# Patient Record
Sex: Female | Born: 1970
Health system: Southern US, Community
[De-identification: ages and names within clinical notes are randomized; demographics above are authoritative.]

## PROBLEM LIST (undated history)

## (undated) HISTORY — PX: TOOTH EXTRACTION: SUR596

## (undated) HISTORY — PX: KNEE CARTILAGE SURGERY: SHX688

---

## 1998-03-01 ENCOUNTER — Inpatient Hospital Stay (HOSPITAL_COMMUNITY): Admission: AD | Admit: 1998-03-01 | Discharge: 1998-03-05 | Payer: Self-pay | Admitting: Obstetrics and Gynecology

## 1998-04-11 ENCOUNTER — Other Ambulatory Visit: Admission: RE | Admit: 1998-04-11 | Discharge: 1998-04-11 | Payer: Self-pay | Admitting: Obstetrics and Gynecology

## 1999-05-06 ENCOUNTER — Other Ambulatory Visit: Admission: RE | Admit: 1999-05-06 | Discharge: 1999-05-06 | Payer: Self-pay | Admitting: Obstetrics and Gynecology

## 1999-12-02 HISTORY — PX: DILATION AND CURETTAGE OF UTERUS: SHX78

## 2000-09-18 ENCOUNTER — Inpatient Hospital Stay (HOSPITAL_COMMUNITY): Admission: AD | Admit: 2000-09-18 | Discharge: 2000-09-21 | Payer: Self-pay | Admitting: Obstetrics and Gynecology

## 2000-09-27 ENCOUNTER — Observation Stay (HOSPITAL_COMMUNITY): Admission: AD | Admit: 2000-09-27 | Discharge: 2000-09-28 | Payer: Self-pay | Admitting: Obstetrics and Gynecology

## 2000-09-27 ENCOUNTER — Encounter: Payer: Self-pay | Admitting: Obstetrics and Gynecology

## 2000-10-29 ENCOUNTER — Other Ambulatory Visit: Admission: RE | Admit: 2000-10-29 | Discharge: 2000-10-29 | Payer: Self-pay | Admitting: Obstetrics and Gynecology

## 2002-03-21 ENCOUNTER — Other Ambulatory Visit: Admission: RE | Admit: 2002-03-21 | Discharge: 2002-03-21 | Payer: Self-pay | Admitting: Obstetrics and Gynecology

## 2003-04-21 ENCOUNTER — Other Ambulatory Visit: Admission: RE | Admit: 2003-04-21 | Discharge: 2003-04-21 | Payer: Self-pay | Admitting: Obstetrics and Gynecology

## 2004-05-28 ENCOUNTER — Other Ambulatory Visit: Admission: RE | Admit: 2004-05-28 | Discharge: 2004-05-28 | Payer: Self-pay | Admitting: Obstetrics and Gynecology

## 2005-07-21 ENCOUNTER — Other Ambulatory Visit: Admission: RE | Admit: 2005-07-21 | Discharge: 2005-07-21 | Payer: Self-pay | Admitting: Obstetrics and Gynecology

## 2005-08-22 ENCOUNTER — Ambulatory Visit (HOSPITAL_COMMUNITY): Admission: RE | Admit: 2005-08-22 | Discharge: 2005-08-22 | Payer: Self-pay | Admitting: Obstetrics and Gynecology

## 2011-06-12 ENCOUNTER — Other Ambulatory Visit: Payer: Self-pay | Admitting: Obstetrics and Gynecology

## 2011-09-08 ENCOUNTER — Other Ambulatory Visit: Payer: Self-pay | Admitting: Orthopedic Surgery

## 2011-09-08 DIAGNOSIS — M25561 Pain in right knee: Secondary | ICD-10-CM

## 2011-09-11 ENCOUNTER — Ambulatory Visit
Admission: RE | Admit: 2011-09-11 | Discharge: 2011-09-11 | Disposition: A | Discharge status: Home / Self Care | Payer: BC Managed Care – PPO | Source: Ambulatory Visit | Attending: Orthopedic Surgery | Admitting: Orthopedic Surgery

## 2011-09-11 DIAGNOSIS — M25561 Pain in right knee: Secondary | ICD-10-CM

## 2014-10-24 ENCOUNTER — Other Ambulatory Visit: Payer: Self-pay | Admitting: Orthopaedic Surgery

## 2014-10-24 DIAGNOSIS — M25511 Pain in right shoulder: Secondary | ICD-10-CM

## 2014-11-27 ENCOUNTER — Ambulatory Visit
Admission: RE | Admit: 2014-11-27 | Discharge: 2014-11-27 | Disposition: A | Discharge status: Home / Self Care | Payer: 59 | Source: Ambulatory Visit | Attending: Orthopaedic Surgery | Admitting: Orthopaedic Surgery

## 2014-11-27 DIAGNOSIS — M25511 Pain in right shoulder: Secondary | ICD-10-CM

## 2015-03-27 ENCOUNTER — Other Ambulatory Visit: Payer: Self-pay | Admitting: Obstetrics and Gynecology

## 2015-03-29 LAB — CYTOLOGY - PAP

## 2016-10-09 ENCOUNTER — Ambulatory Visit (INDEPENDENT_AMBULATORY_CARE_PROVIDER_SITE_OTHER): Payer: BLUE CROSS/BLUE SHIELD

## 2016-10-09 ENCOUNTER — Encounter: Payer: Self-pay | Admitting: Podiatry

## 2016-10-09 ENCOUNTER — Ambulatory Visit (INDEPENDENT_AMBULATORY_CARE_PROVIDER_SITE_OTHER): Payer: BLUE CROSS/BLUE SHIELD | Admitting: Podiatry

## 2016-10-09 VITALS — BP 128/84 | HR 85 | Resp 16 | Ht 67.0 in | Wt 240.0 lb

## 2016-10-09 DIAGNOSIS — M79671 Pain in right foot: Secondary | ICD-10-CM

## 2016-10-09 DIAGNOSIS — D492 Neoplasm of unspecified behavior of bone, soft tissue, and skin: Secondary | ICD-10-CM

## 2016-10-09 NOTE — Progress Notes (Signed)
   Subjective:    Patient ID: Angela Campos, female    DOB: 1971-11-23, 45 y.o.   MRN: [id number]  HPI Chief Complaint  Patient presents with  . Foot Pain    Right foot; midfoot; hard knot; pt stated, "has had hard knot for the past week and there is a bruise underneath it"      Review of Systems  All other systems reviewed and are negative.      Objective:   Physical Exam        Assessment & Plan:

## 2016-10-09 NOTE — Progress Notes (Signed)
Subjective:     Patient ID: Angela Campos, female   DOB: [birth year], 45 y.o.   MRN: [id number]  HPI patient presents with a small nodule on the distal portion of the right arch mid arch area with bruising proximal to this. States she's noticed it for a week it's been painful but not as painful last few days   Review of Systems  All other systems reviewed and are negative.      Objective:   Physical Exam  Constitutional: She is oriented to person, place, and time.  Cardiovascular: Intact distal pulses.   Musculoskeletal: Normal range of motion.  Neurological: She is oriented to person, place, and time.  Skin: Skin is warm.  Nursing note and vitals reviewed.  neurovascular status intact muscle strength adequate range of motion within normal limits with patient noted to have a small 3 x 3 mm nodule plantar aspect right foot distal to the fascia before the metatarsal heads with a small area of bruising proximal to it. The bruising is nonpainful and a nodule is mildly to moderately painful and not as bad as it was several days ago. Patient's found have good digital perfusion well oriented 3     Assessment:     Nodule which may be a soft tissue tumor or congealed hematoma or possible fibroma    Plan:    H&P x-rays reviewed and today I have advised on heat ice therapy for this and if symptoms were to persist for another month we may try to inject with steroid or it may need to be excised. Patient will be seen back for [place] to recheck again in 4 weeks or earlier if needed   X-ray was negative for signs of bony calcification fracture or other bone pathology

## 2017-05-07 ENCOUNTER — Ambulatory Visit (HOSPITAL_COMMUNITY)
Admission: EM | Admit: 2017-05-07 | Discharge: 2017-05-07 | Disposition: A | Discharge status: Home / Self Care | Payer: BLUE CROSS/BLUE SHIELD | Attending: Internal Medicine | Admitting: Internal Medicine

## 2017-05-07 ENCOUNTER — Encounter (HOSPITAL_COMMUNITY): Payer: Self-pay | Admitting: Emergency Medicine

## 2017-05-07 ENCOUNTER — Ambulatory Visit (INDEPENDENT_AMBULATORY_CARE_PROVIDER_SITE_OTHER): Payer: BLUE CROSS/BLUE SHIELD

## 2017-05-07 DIAGNOSIS — R042 Hemoptysis: Secondary | ICD-10-CM

## 2017-05-07 MED ORDER — IPRATROPIUM BROMIDE 0.06 % NA SOLN: 2.0000 | Freq: Four times a day (QID) | NASAL | 0 refills | Status: DC

## 2017-05-07 NOTE — ED Provider Notes (Signed)
CSN: [id number]     Arrival date & time 05/07/17  2423 History   None    Chief Complaint  Patient presents with  . URI   (Consider location/radiation/quality/duration/timing/severity/associated sxs/prior Treatment) Patient c/o spitting up some blood this am in the shower and when she was driving to work.  She states she feels like she has had some cold sx's like some sinus congestion but denies fever, cough, or chest congestion.  She takes an occasional aleve for arthritis sx's.  She seldom comes to the doctor she states.     The history is provided by the patient.  URI  Presenting symptoms: congestion   Severity:  Mild Onset quality:  Sudden Duration:  1 day Timing:  Constant Chronicity:  New Relieved by:  Nothing Worsened by:  Nothing Ineffective treatments:  None tried   History reviewed. No pertinent past medical history. History reviewed. No pertinent surgical history. History reviewed. No pertinent family history. Social History  Substance Use Topics  . Smoking status: Never Smoker  . Smokeless tobacco: Never Used  . Alcohol use Yes   OB History    No data available     Review of Systems  Constitutional: Negative.   HENT: Positive for congestion.   Eyes: Negative.   Respiratory: Negative.   Cardiovascular: Negative.   Gastrointestinal: Negative.   Endocrine: Negative.   Genitourinary: Negative.   Musculoskeletal: Negative.   Allergic/Immunologic: Negative.   Neurological: Negative.   Hematological: Negative.   Psychiatric/Behavioral: Negative.     Allergies  Patient has no known allergies.  Home Medications   Prior to Admission medications   Medication Sig Start Date End Date Taking? Authorizing Provider  ipratropium (ATROVENT) 0.06 % nasal spray Place 2 sprays into both nostrils 4 (four) times daily. 05/07/17   Lysbeth Penner, FNP   Meds Ordered and Administered this Visit  Medications - No data to display  BP (!) 148/67 (BP Location: Left Arm)  Comment: rn notified   Pulse 75   Temp 98.1 F (36.7 C) (Oral)   Resp 16   SpO2 96%  No data found.   Physical Exam  Constitutional: She appears well-developed and well-nourished.  HENT:  Head: Normocephalic and atraumatic.  Eyes: Conjunctivae and EOM are normal. Pupils are equal, round, and reactive to light.  Neck: Normal range of motion. Neck supple.  Cardiovascular: Normal rate, regular rhythm and normal heart sounds.   Pulmonary/Chest: Effort normal and breath sounds normal.  Nursing note and vitals reviewed.   Urgent Care Course     Procedures (including critical care time)  Labs Review Labs Reviewed - No data to display  Imaging Review Dg Chest 2 View  Result Date: 05/07/2017 CLINICAL DATA:  Hemoptysis, single episode EXAM: CHEST  2 VIEW COMPARISON:  None. FINDINGS: There is scarring in the medial right mid lower lung zone. Lungs elsewhere clear. Heart is upper normal in size with pulmonary vascularity within normal limits. No adenopathy no bone lesions. IMPRESSION: No edema or consolidation. Scarring medial right lung. No adenopathy evident. Electronically Signed   By: Lowella Grip III M.D.   On: 05/07/2017 10:48     Visual Acuity Review  Right Eye Distance:   Left Eye Distance:   Bilateral Distance:    Right Eye Near:   Left Eye Near:    Bilateral Near:         MDM   1. Hemoptysis    Ipratropium nasal spray  Reassurance given and explained that this may  be from viral uri and sinus congestion etc.   Follow up with PCP      Lysbeth Penner, FNP 05/07/17 1114

## 2017-05-07 NOTE — ED Triage Notes (Signed)
Pt c/o cold sx onset 2 days associated w/bloody sputum that began today  Denies fevers, chills, fatigue, malaise  A&O x4... NAD.... ambulatory

## 2018-01-27 ENCOUNTER — Encounter (INDEPENDENT_AMBULATORY_CARE_PROVIDER_SITE_OTHER): Payer: Self-pay | Admitting: Orthopaedic Surgery

## 2018-01-27 ENCOUNTER — Ambulatory Visit (INDEPENDENT_AMBULATORY_CARE_PROVIDER_SITE_OTHER): Payer: BLUE CROSS/BLUE SHIELD | Admitting: Orthopaedic Surgery

## 2018-01-27 DIAGNOSIS — M25061 Hemarthrosis, right knee: Secondary | ICD-10-CM

## 2018-01-27 DIAGNOSIS — M25561 Pain in right knee: Secondary | ICD-10-CM

## 2018-01-27 MED ORDER — METHYLPREDNISOLONE ACETATE 40 MG/ML IJ SUSP
40.0000 mg | INTRAMUSCULAR | Status: AC | PRN
  Administered 2018-01-27: 40 mg via INTRA_ARTICULAR

## 2018-01-27 MED ORDER — LIDOCAINE HCL 1 % IJ SOLN
3.0000 mL | INTRAMUSCULAR | Status: AC | PRN
  Administered 2018-01-27: 3 mL

## 2018-01-27 NOTE — Progress Notes (Signed)
Office Visit Note   Patient: Angela Campos           Date of Birth: 10-Apr-1971           MRN: [id number] Visit Date: 01/27/2018              Requested by: No referring provider defined for this encounter. PCP: Patient, No Pcp Per   Assessment & Plan: Visit Diagnoses:  1. Acute pain of right knee   2. Hemarthrosis of right knee     Plan: Given the large knee effusion I was able to tap her knee.  I aspirated 80 cc of a large hemarthrosis from her right knee.  I then placed an injection of a steroid and lidocaine in the knee.  I will place her in a hinged knee brace.  At this point an MRI is medically warranted given the large hemarthrosis we found in her right knee.  This right knee MRI will be needed to assess the ligamentous structures of the knee and the joint capsule as well as a meniscus given the large hemarthrosis showing that this is an acute injury.  She will continue her crutch for now until she feels better getting around on her knee.  She will work on flexion extension of it while awaiting the MRI.  All questions concerns were answered and addressed.  Follow-Up Instructions: Return in about 2 weeks (around 02/10/2018).   Orders:  No orders of the defined types were placed in this encounter.  No orders of the defined types were placed in this encounter.     Procedures: Large Joint Inj: R knee on 01/27/2018 1:19 PM Indications: diagnostic evaluation and pain Details: 22 G 1.5 in needle, superolateral approach  Arthrogram: No  Medications: 3 mL lidocaine 1 %; 40 mg methylPREDNISolone acetate 40 MG/ML Outcome: tolerated well, no immediate complications Procedure, treatment alternatives, risks and benefits explained, specific risks discussed. Consent was given by the patient. Immediately prior to procedure a time out was called to verify the correct patient, procedure, equipment, support staff and site/side marked as required. Patient was prepped and draped in the usual  sterile fashion.       Clinical Data: No additional findings.   Subjective: Chief Complaint  Patient presents with  . Right Foot - Pain, Injury  The patient comes in today with acute right knee injury.  This happened yesterday when she was in bed and twisted her knee.  She is unable to straighten her knee out and comes in today using a crutch being unable to put weight on her right knee.  She says she is abundant amount of swelling and pain in the back of her knee as well.  She actually had arthroscopic intervention of this knee in 2012 for a symptomatic plica and a small meniscal tear.  This was done elsewhere.  She had no problems with her knee until this accident yesterday.  HPI  Review of Systems She currently denies any headache, chest pain, shortness of breath, fever, chills, nausea, vomiting.  Objective: Vital Signs: There were no vitals taken for this visit.  Physical Exam She is alert and oriented x3 and in no acute distress Ortho Exam Examination of her right knee shows a very large effusion.  It is keeping me from really fully flexing and extending her knee.  She has medial joint line tenderness and a positive McMurray on the medial side.  She has some slight laxity in her ACL as well. Specialty  Comments:  No specialty comments available.  Imaging: No results found.   PMFS History: Patient Active Problem List   Diagnosis Date Noted  . Acute pain of right knee 01/27/2018   History reviewed. No pertinent past medical history.  History reviewed. No pertinent family history.  History reviewed. No pertinent surgical history. Social History   Occupational History  . Not on file  Tobacco Use  . Smoking status: Never Smoker  . Smokeless tobacco: Never Used  Substance and Sexual Activity  . Alcohol use: Yes  . Drug use: No  . Sexual activity: Not on file

## 2018-01-29 ENCOUNTER — Other Ambulatory Visit (INDEPENDENT_AMBULATORY_CARE_PROVIDER_SITE_OTHER): Payer: Self-pay

## 2018-01-29 DIAGNOSIS — M25561 Pain in right knee: Principal | ICD-10-CM

## 2018-01-29 DIAGNOSIS — G8929 Other chronic pain: Secondary | ICD-10-CM

## 2018-02-09 ENCOUNTER — Ambulatory Visit
Admission: RE | Admit: 2018-02-09 | Discharge: 2018-02-09 | Disposition: A | Discharge status: Home / Self Care | Payer: BLUE CROSS/BLUE SHIELD | Source: Ambulatory Visit | Attending: Orthopaedic Surgery | Admitting: Orthopaedic Surgery

## 2018-02-09 DIAGNOSIS — M25561 Pain in right knee: Principal | ICD-10-CM

## 2018-02-09 DIAGNOSIS — G8929 Other chronic pain: Secondary | ICD-10-CM

## 2018-02-10 ENCOUNTER — Ambulatory Visit (INDEPENDENT_AMBULATORY_CARE_PROVIDER_SITE_OTHER): Payer: BLUE CROSS/BLUE SHIELD | Admitting: Orthopaedic Surgery

## 2018-02-10 ENCOUNTER — Encounter (INDEPENDENT_AMBULATORY_CARE_PROVIDER_SITE_OTHER): Payer: Self-pay | Admitting: Orthopaedic Surgery

## 2018-02-10 DIAGNOSIS — S83241A Other tear of medial meniscus, current injury, right knee, initial encounter: Secondary | ICD-10-CM | POA: Diagnosis not present

## 2018-02-10 DIAGNOSIS — M25561 Pain in right knee: Secondary | ICD-10-CM

## 2018-02-10 NOTE — Progress Notes (Signed)
The patient returns today for follow-up after having an MRI of her right knee.  She has a history of a previous arthroscopic intervention years ago and a small medial meniscal tear.  She is been doing well until day of the week she twisted her knee in bed and came into the office here with a large effusion.  I aspirated the knee and found a large hemarthrosis of 80 cc.  We sent her for an MRI.  She is here for review this today.  She still wearing a knee brace and having medial joint line tenderness.  On examination of her right knee she does have medial joint line tenderness and a positive Murray sign to the medial side.  She does have a moderate effusion again.  MRI is reviewed with her and it does show a complex medial meniscal tear.  Unfortunately she does have areas of full-thickness cartilage loss on the medial compartment of her knee on both the femur and the tibial plateau.  There is also full-thickness cartilage loss of the trochlear groove.  At this point we are recommending arthroscopic intervention given her young age and given the mechanical symptoms of locking catching in her knee with a large hemarthrosis.  I showed her knee model explained in detail what this involves.  This will not address the arthritic changes in her knee she will likely need hyaluronic acid at a later date as well.  All questions concerns were answered and addressed.  We will work on getting the surgery scheduled in the near future.

## 2018-02-25 DIAGNOSIS — S83241A Other tear of medial meniscus, current injury, right knee, initial encounter: Secondary | ICD-10-CM | POA: Diagnosis not present

## 2018-03-04 ENCOUNTER — Ambulatory Visit (INDEPENDENT_AMBULATORY_CARE_PROVIDER_SITE_OTHER): Payer: BLUE CROSS/BLUE SHIELD | Admitting: Orthopaedic Surgery

## 2018-03-04 ENCOUNTER — Encounter (INDEPENDENT_AMBULATORY_CARE_PROVIDER_SITE_OTHER): Payer: Self-pay | Admitting: Orthopaedic Surgery

## 2018-03-04 DIAGNOSIS — M1711 Unilateral primary osteoarthritis, right knee: Secondary | ICD-10-CM | POA: Insufficient documentation

## 2018-03-04 DIAGNOSIS — Z9889 Other specified postprocedural states: Secondary | ICD-10-CM

## 2018-03-04 NOTE — Progress Notes (Signed)
The patient is one-week status post a right knee arthroscopy.  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  She does have a moderate effusion postoperatively but is doing well overall.  She does not need to have this drained off she states.  On exam I remove the sutures easily.  Her knee flexes and extends well without any significant issues other than a mild to moderate effusion.  I went over arthroscopy pictures not show the extent of the surgery and the extent of the arthritis in her knee.  She is only 47 years old.  The next step will be trying hyaluronic acid injection in the right knee to help with her moderate osteoarthritis pain.  We will see her back in a month to place an injection in her knee.  All questions and concerns were answered and addressed.

## 2018-03-05 ENCOUNTER — Telehealth (INDEPENDENT_AMBULATORY_CARE_PROVIDER_SITE_OTHER): Payer: Self-pay

## 2018-03-05 NOTE — Telephone Encounter (Signed)
Submitted application online for SynviscOne, right knee. 

## 2018-04-01 ENCOUNTER — Ambulatory Visit (INDEPENDENT_AMBULATORY_CARE_PROVIDER_SITE_OTHER): Payer: BLUE CROSS/BLUE SHIELD | Admitting: Orthopaedic Surgery

## 2018-04-01 ENCOUNTER — Encounter (INDEPENDENT_AMBULATORY_CARE_PROVIDER_SITE_OTHER): Payer: Self-pay | Admitting: Orthopaedic Surgery

## 2018-04-01 DIAGNOSIS — M1711 Unilateral primary osteoarthritis, right knee: Secondary | ICD-10-CM

## 2018-04-01 MED ORDER — HYLAN G-F 20 48 MG/6ML IX SOSY
48.0000 mg | PREFILLED_SYRINGE | INTRA_ARTICULAR | Status: AC | PRN
  Administered 2018-04-01: 48 mg via INTRA_ARTICULAR

## 2018-04-01 NOTE — Progress Notes (Signed)
   Procedure Note  Patient: Angela Campos             Date of Birth: [birth year]           MRN: [id number]             Visit Date: 04/01/2018  Procedures: Visit Diagnoses: Unilateral primary osteoarthritis, right knee  Large Joint Inj: R knee on 04/01/2018 4:07 PM Indications: pain and diagnostic evaluation Details: 22 G 1.5 in needle, superolateral approach  Arthrogram: No  Medications: 48 mg Hylan 48 MG/6ML Outcome: tolerated well, no immediate complications Procedure, treatment alternatives, risks and benefits explained, specific risks discussed. Consent was given by the patient. Immediately prior to procedure a time out was called to verify the correct patient, procedure, equipment, support staff and site/side marked as required. Patient was prepped and draped in the usual sterile fashion.    The patient comes in today for scheduled hyaluronic acid injection with Synvisc 1 in her right knee.  She is only 47 years old but has significant arthritis in her right knee.  She has had multiple surgical interventions on the knee and has had a steroid injection as well.  On exam she does have a moderate right knee joint effusion and I aspirated 30 cc of fluid from her knee.  I then placed the Synvisc 1 to the superior lateral aspect of the right knee without difficulty.  We had a long thorough discussion about her knee.  All questions concerns were answered and addressed.  At this point follow-up will be as needed.

## 2018-04-28 ENCOUNTER — Telehealth (INDEPENDENT_AMBULATORY_CARE_PROVIDER_SITE_OTHER): Payer: Self-pay

## 2018-04-28 NOTE — Telephone Encounter (Signed)
Faxed completed PA form to [employer] at [phone]. 

## 2018-05-03 ENCOUNTER — Telehealth (INDEPENDENT_AMBULATORY_CARE_PROVIDER_SITE_OTHER): Payer: Self-pay

## 2018-05-03 NOTE — Telephone Encounter (Signed)
Approved for SynviscOne injection, right knee. Reference #[id number]

## 2018-09-14 ENCOUNTER — Telehealth (INDEPENDENT_AMBULATORY_CARE_PROVIDER_SITE_OTHER): Payer: Self-pay | Admitting: Orthopaedic Surgery

## 2018-09-14 NOTE — Telephone Encounter (Signed)
Returned call to patient got recording not receiving calls at this time    could not leave message

## 2018-09-20 ENCOUNTER — Ambulatory Visit (INDEPENDENT_AMBULATORY_CARE_PROVIDER_SITE_OTHER): Payer: BLUE CROSS/BLUE SHIELD | Admitting: Orthopaedic Surgery

## 2018-09-20 ENCOUNTER — Encounter (INDEPENDENT_AMBULATORY_CARE_PROVIDER_SITE_OTHER): Payer: Self-pay | Admitting: Orthopaedic Surgery

## 2018-09-20 VITALS — Ht 67.0 in | Wt 180.0 lb

## 2018-09-20 DIAGNOSIS — M25561 Pain in right knee: Secondary | ICD-10-CM

## 2018-09-20 DIAGNOSIS — M1711 Unilateral primary osteoarthritis, right knee: Secondary | ICD-10-CM | POA: Diagnosis not present

## 2018-09-20 MED ORDER — LIDOCAINE HCL 1 % IJ SOLN
3.0000 mL | INTRAMUSCULAR | Status: AC | PRN
Start: 2018-09-20 — End: 2018-09-20
  Administered 2018-09-20: 3 mL

## 2018-09-20 MED ORDER — METHYLPREDNISOLONE ACETATE 40 MG/ML IJ SUSP
40.0000 mg | INTRAMUSCULAR | Status: AC | PRN
  Administered 2018-09-20: 40 mg via INTRA_ARTICULAR

## 2018-09-20 NOTE — Progress Notes (Signed)
Office Visit Note   Patient: Angela Campos           Date of Birth: Apr 20, 1971           MRN: [id number] Visit Date: 09/20/2018              Requested by: No referring provider defined for this encounter. PCP: Patient, No Pcp Per   Assessment & Plan: Visit Diagnoses:  1. Unilateral primary osteoarthritis, right knee   2. Acute pain of right knee     Plan: I agree with continuing this conservative treatment path with a steroid injection today in the right knee in the next month the hyaluronic acid injection in the right knee.  She understands risk and benefits of these types of injections and all question concerns were answered and addressed.  She tolerated the steroid injection well in the right knee today and we will see her back next month for Synvisc 1 again and her right knee.  Follow-Up Instructions: Return in about 4 weeks (around 10/18/2018).   Orders:  No orders of the defined types were placed in this encounter.  No orders of the defined types were placed in this encounter.     Procedures: Large Joint Inj: R knee on 09/20/2018 4:23 PM Indications: diagnostic evaluation and pain Details: 22 G 1.5 in needle, superolateral approach  Arthrogram: No  Medications: 3 mL lidocaine 1 %; 40 mg methylPREDNISolone acetate 40 MG/ML Outcome: tolerated well, no immediate complications Procedure, treatment alternatives, risks and benefits explained, specific risks discussed. Consent was given by the patient. Immediately prior to procedure a time out was called to verify the correct patient, procedure, equipment, support staff and site/side marked as required. Patient was prepped and draped in the usual sterile fashion.       Clinical Data: No additional findings.   Subjective: Chief Complaint  Patient presents with  . Right Knee - Pain  The patient is well-known to [place].  She has significant arthritis of her right knee mainly the medial compartment of the knee.  She is had  multiple arthroscopic interventions for that knee.  She is only 47 years old.  She has had steroid injections and hyaluronic acid injections in the past.  These have helped temporize her symptoms.  She is interested in having this regimen again.  She said no other acute change in her medical status.  Her right knee does hurt the same that is hurt with activities and weightbearing.  HPI  Review of Systems  She currently denies any systemic illnesses Objective: Vital Signs: Ht 5\' 7"  (1.702 m)   Wt 180 lb (81.6 kg)   BMI 28.19 kg/m   Physical Exam She is alert and oriented no acute distress Ortho Exam Examination of her right knee shows varus malalignment with no effusion and mainly medial joint line tenderness with full range of motion. Specialty Comments:  No specialty comments available.  Imaging: No results found.   PMFS History: Patient Active Problem List   Diagnosis Date Noted  . Status post arthroscopy of right knee 03/04/2018  . Unilateral primary osteoarthritis, right knee 03/04/2018  . Acute pain of right knee 01/27/2018   No past medical history on file.  No family history on file.  No past surgical history on file. Social History   Occupational History  . Not on file  Tobacco Use  . Smoking status: Never Smoker  . Smokeless tobacco: Never Used  Substance and Sexual Activity  . Alcohol  use: Yes  . Drug use: No  . Sexual activity: Not on file

## 2018-09-21 ENCOUNTER — Telehealth (INDEPENDENT_AMBULATORY_CARE_PROVIDER_SITE_OTHER): Payer: Self-pay

## 2018-09-21 ENCOUNTER — Other Ambulatory Visit (INDEPENDENT_AMBULATORY_CARE_PROVIDER_SITE_OTHER): Payer: Self-pay

## 2018-09-21 NOTE — Telephone Encounter (Signed)
Right knee gel injection  

## 2018-09-21 NOTE — Telephone Encounter (Signed)
Noted  

## 2018-10-04 ENCOUNTER — Telehealth (INDEPENDENT_AMBULATORY_CARE_PROVIDER_SITE_OTHER): Payer: Self-pay

## 2018-10-04 NOTE — Telephone Encounter (Signed)
Submitted VOB for SynviscOne, right knee. 

## 2018-10-08 ENCOUNTER — Telehealth (INDEPENDENT_AMBULATORY_CARE_PROVIDER_SITE_OTHER): Payer: Self-pay

## 2018-10-08 NOTE — Telephone Encounter (Signed)
Patient is approved for SynviscOne, right knee. [place] Patient will be responsible for 30% OOP. No Co-pay PA required PA Approval# [id number] Valid 04/28/2018- 04/28/2019

## 2018-10-18 ENCOUNTER — Encounter (INDEPENDENT_AMBULATORY_CARE_PROVIDER_SITE_OTHER): Payer: Self-pay | Admitting: Orthopaedic Surgery

## 2018-10-18 ENCOUNTER — Ambulatory Visit (INDEPENDENT_AMBULATORY_CARE_PROVIDER_SITE_OTHER): Payer: BLUE CROSS/BLUE SHIELD | Admitting: Orthopaedic Surgery

## 2018-10-18 DIAGNOSIS — M1711 Unilateral primary osteoarthritis, right knee: Secondary | ICD-10-CM | POA: Diagnosis not present

## 2018-10-18 MED ORDER — HYLAN G-F 20 48 MG/6ML IX SOSY
48.0000 mg | PREFILLED_SYRINGE | INTRA_ARTICULAR | Status: AC | PRN
Start: 2018-10-18 — End: 2018-10-18
  Administered 2018-10-18: 48 mg via INTRA_ARTICULAR

## 2018-10-18 NOTE — Progress Notes (Signed)
   Procedure Note  Patient: Angela Campos             Date of Birth: 09/18/1971           MRN: [id number]             Visit Date: 10/18/2018  Procedures: Visit Diagnoses: Unilateral primary osteoarthritis, right knee  Large Joint Inj: R knee on 10/18/2018 4:00 PM Indications: pain and diagnostic evaluation Details: 22 G 1.5 in needle, superolateral approach  Arthrogram: No  Medications: 48 mg Hylan 48 MG/6ML Outcome: tolerated well, no immediate complications Procedure, treatment alternatives, risks and benefits explained, specific risks discussed. Consent was given by the patient. Immediately prior to procedure a time out was called to verify the correct patient, procedure, equipment, support staff and site/side marked as required. Patient was prepped and draped in the usual sterile fashion.    The patient is here for scheduled Synvisc 1 injection in the right knee to treat the pain from posttraumatic osteoarthritis.  She is been dealing with pain for some time.  Is been 6 months since her last hyaluronic acid injection and that helped some.  On examination of her right knee there is no large effusion.  She has medial lateral joint tenderness and patellofemoral crepitation.  She tolerated the Synvisc 1 injection on the right knee.  All question concerns were answered and addressed.  She knows she get a steroid injection at a later date and at some point will proceed with knee replacement surgery when all conservative treatment measures fail.

## 2019-07-01 IMAGING — MR MR KNEE*R* W/O CM
4 of 5 series · 19 of 40 positions shown · non-contrast
Comparison: Right knee MRI dated September 11, 2011.

CLINICAL DATA: Medial knee pain for the past 2 weeks.

EXAM:
MRI OF THE RIGHT KNEE WITHOUT CONTRAST
TECHNIQUE: Multiplanar, multisequence MR imaging of the knee was performed. No
intravenous contrast was administered.

[Series 3: PD fat-sat · axial · 4.0mm · 0.31mm/px · z∈[-63,+42]mm · 10 of 28 slices shown (1 of 3)]
[im 1/28]
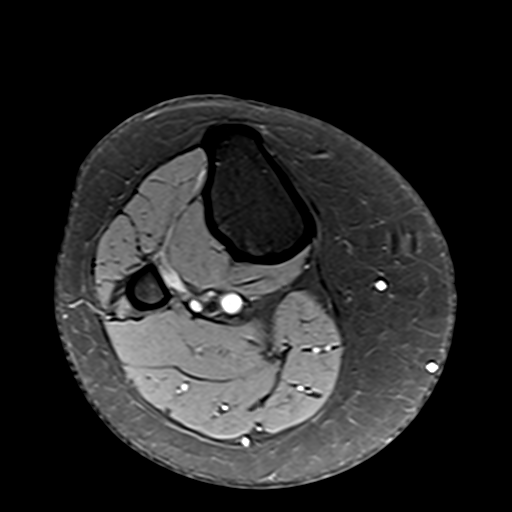
[im 3/28]
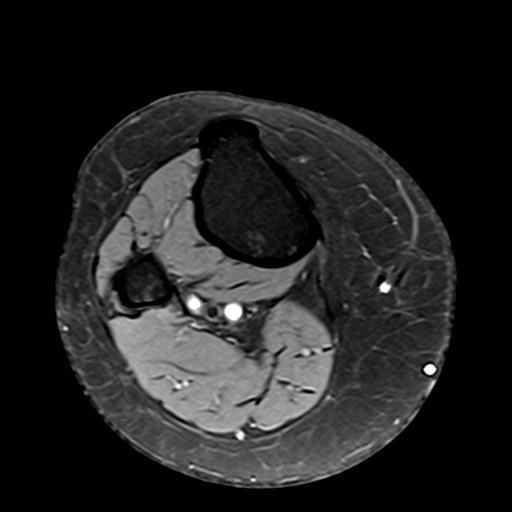
[im 6/28]
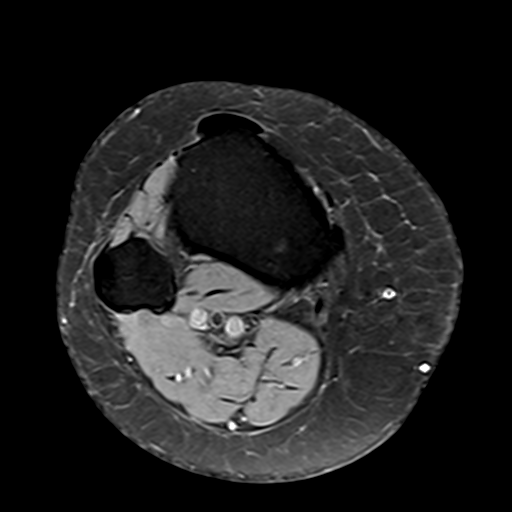
[im 9/28]
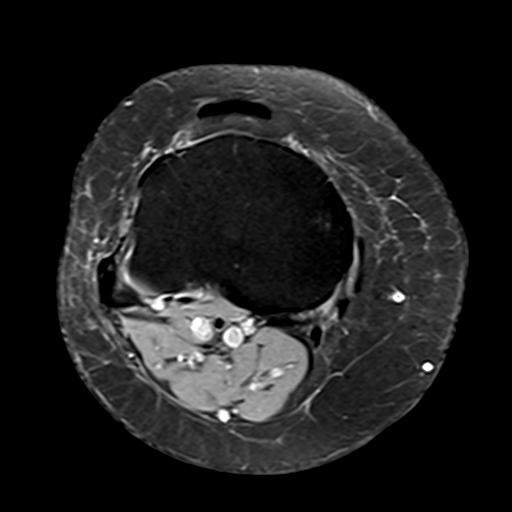
[im 11/28]
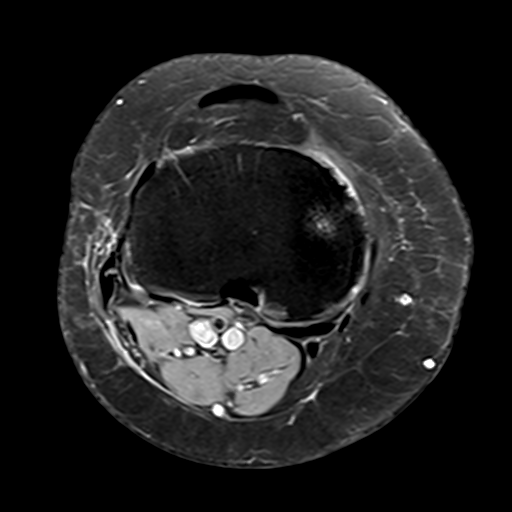
[im 14/28]
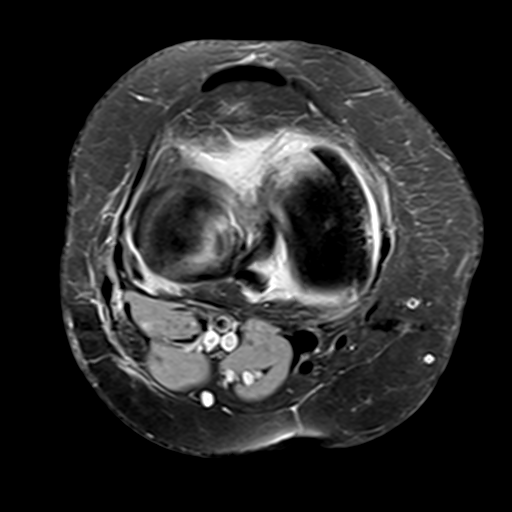
[im 17/28]
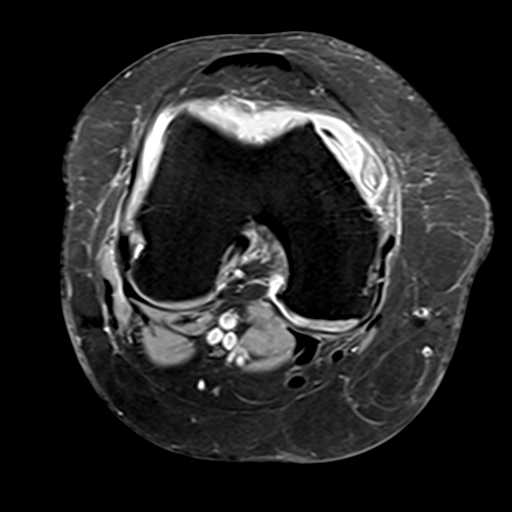
[im 19/28]
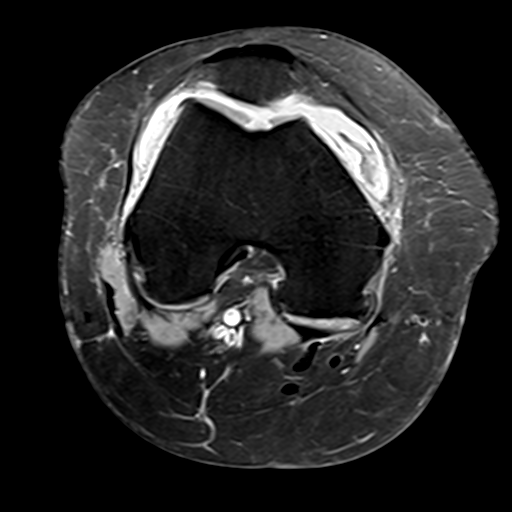
[im 22/28]
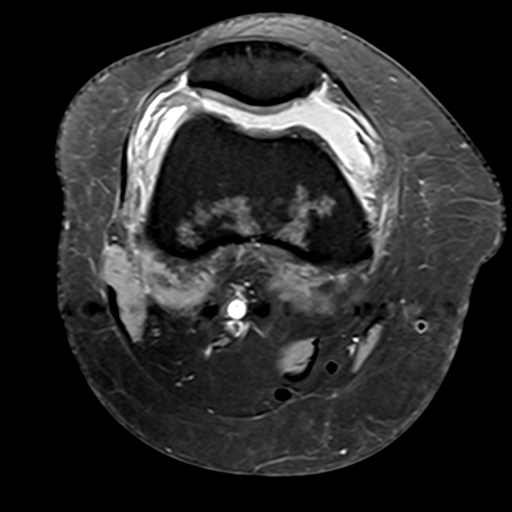
[im 25/28]
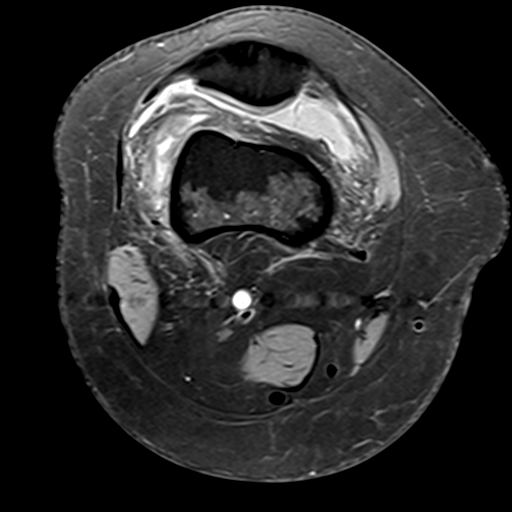

[Series 4: PD fat-sat · sagittal · 4.0mm · 0.31mm/px · 3 of 22 slices shown (2 of 3)]
[im 4/22]
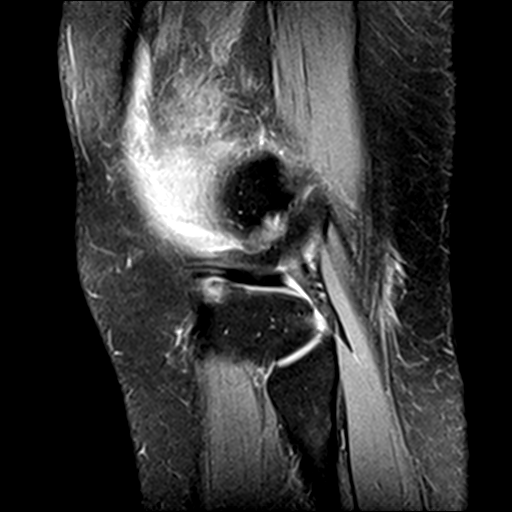
[im 13/22]
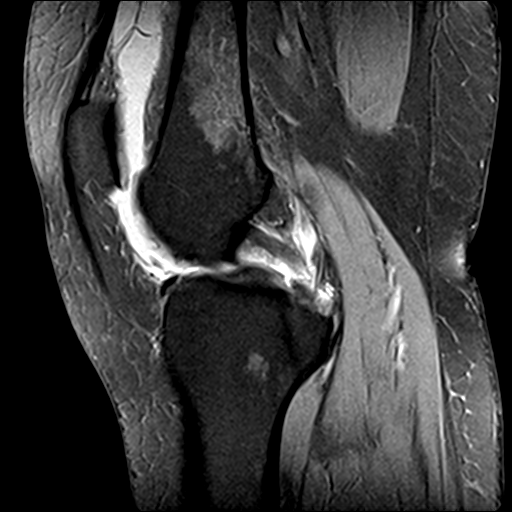
[im 19/22]
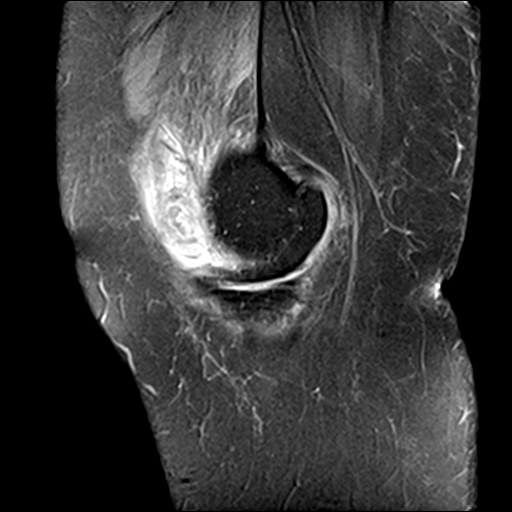

[Series 6: T2 fat-sat · coronal · 4.0mm · 0.31mm/px · 3 of 21 slices shown]
[im 4/21]
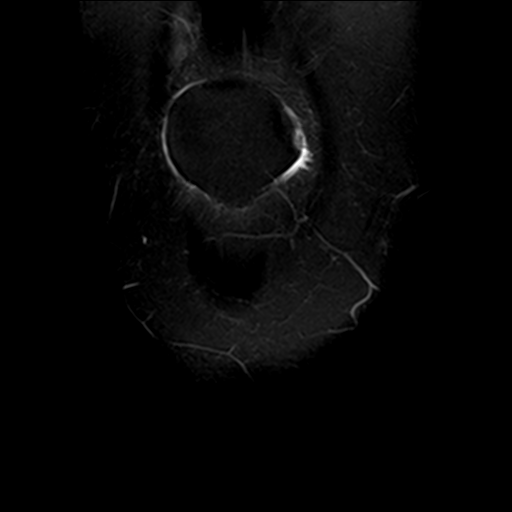
[im 11/21]
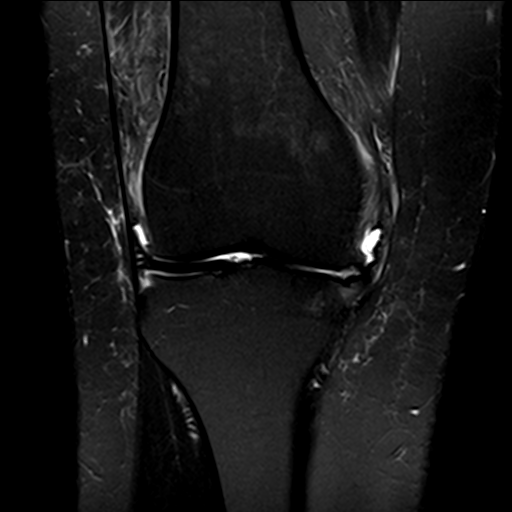
[im 17/21]
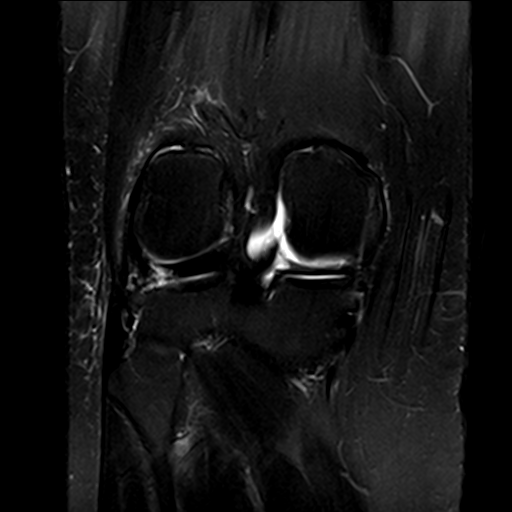

[Series 7: PD fat-sat · coronal · 4.0mm · 0.31mm/px · 3 of 21 slices shown (3 of 3)]
[im 4/21]
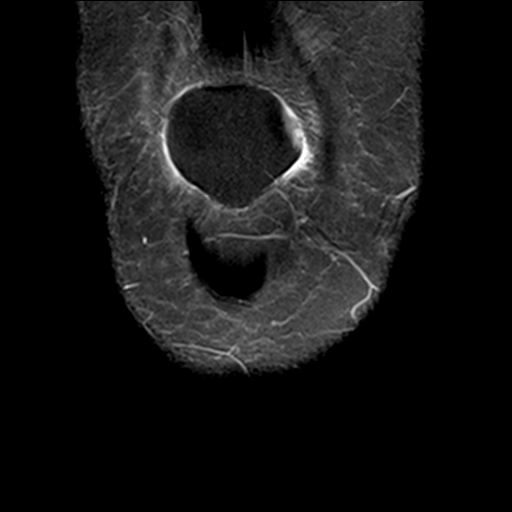
[im 11/21]
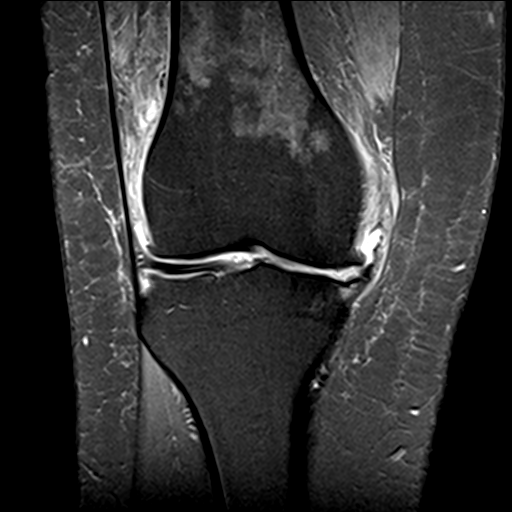
[im 17/21]
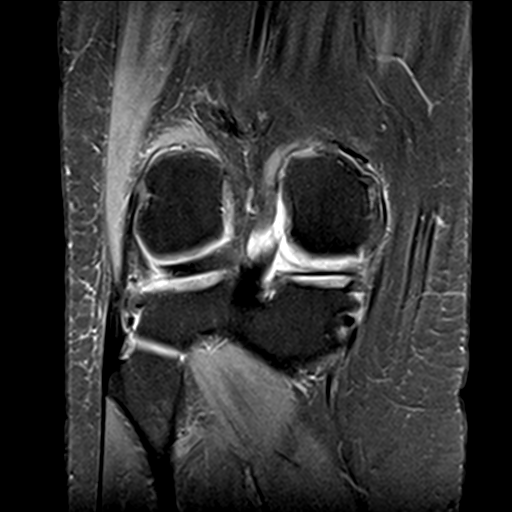

[19 of 40 positions shown; findings below may reference images not displayed]

FINDINGS: MENISCI

Medial meniscus: Complex tear of the posterior horn extending
towards the posterior root. Maceration and complex tearing of the
body.

Lateral meniscus:  Possible small free edge tear of the body.

LIGAMENTS

Cruciates:  Intact ACL and PCL.

Collaterals: Medial collateral ligament is intact. Lateral
collateral ligament complex is intact.

CARTILAGE

Patellofemoral: Progressive cartilage loss with new large
full-thickness defect over the trochlear groove and near
full-thickness cartilage defect over the medial patellar facet.

Medial: Progressive cartilage loss with new large full-thickness
defects over the weight-bearing medial femoral condyle and medial
tibial plateau.

Lateral: Mild partial-thickness cartilage loss without
full-thickness defect.

Joint: Moderate joint effusion with synovitis. Normal Hoffa's fat.
No plical thickening.

Popliteal Fossa:  No Baker cyst. Intact popliteus tendon.

Extensor Mechanism: Intact quadriceps tendon and patellar tendon.
Intact medial and lateral patellar retinaculum. Intact MPFL.

Bones: Degenerative marrow edema is medial femoral condyle and
medial tibial plateau. No fracture or dislocation.

Other: None.
IMPRESSION: 1. Complex tear of the medial meniscus posterior horn extending
towards the posterior root. Complex tearing and maceration of the
medial meniscus body.
2. Possible small radial tear of the lateral meniscus body.
3. Progressive osteoarthritis in the medial and patellofemoral
compartments, with large full-thickness cartilage defects as
described above.
4. Moderate joint effusion with synovitis.

## 2020-07-04 ENCOUNTER — Other Ambulatory Visit: Payer: Self-pay | Admitting: Radiology

## 2021-11-18 ENCOUNTER — Ambulatory Visit: Payer: BLUE CROSS/BLUE SHIELD | Admitting: Physician Assistant

## 2021-11-19 ENCOUNTER — Other Ambulatory Visit: Payer: Self-pay

## 2021-11-19 ENCOUNTER — Ambulatory Visit: Payer: Self-pay

## 2021-11-19 ENCOUNTER — Encounter: Payer: Self-pay | Admitting: Physician Assistant

## 2021-11-19 ENCOUNTER — Ambulatory Visit (INDEPENDENT_AMBULATORY_CARE_PROVIDER_SITE_OTHER): Payer: BC Managed Care – PPO | Admitting: Physician Assistant

## 2021-11-19 ENCOUNTER — Emergency Department (HOSPITAL_COMMUNITY)
Admission: EM | Admit: 2021-11-19 | Discharge: 2021-11-19 | Disposition: A | Discharge status: Home / Self Care | Payer: BC Managed Care – PPO | Attending: Emergency Medicine | Admitting: Emergency Medicine

## 2021-11-19 VITALS — Ht 67.0 in | Wt 245.6 lb

## 2021-11-19 DIAGNOSIS — M25562 Pain in left knee: Secondary | ICD-10-CM | POA: Diagnosis not present

## 2021-11-19 DIAGNOSIS — Y829 Unspecified medical devices associated with adverse incidents: Secondary | ICD-10-CM | POA: Insufficient documentation

## 2021-11-19 DIAGNOSIS — T886XXA Anaphylactic reaction due to adverse effect of correct drug or medicament properly administered, initial encounter: Secondary | ICD-10-CM | POA: Diagnosis not present

## 2021-11-19 DIAGNOSIS — G8929 Other chronic pain: Secondary | ICD-10-CM

## 2021-11-19 DIAGNOSIS — T782XXA Anaphylactic shock, unspecified, initial encounter: Secondary | ICD-10-CM

## 2021-11-19 DIAGNOSIS — T7840XA Allergy, unspecified, initial encounter: Secondary | ICD-10-CM | POA: Diagnosis present

## 2021-11-19 DIAGNOSIS — M1711 Unilateral primary osteoarthritis, right knee: Secondary | ICD-10-CM

## 2021-11-19 LAB — CBC WITH DIFFERENTIAL/PLATELET
Abs Immature Granulocytes: 0.04 10*3/uL (ref 0.00–0.07)
Basophils Absolute: 0 10*3/uL (ref 0.0–0.1)
Basophils Relative: 1 %
Eosinophils Absolute: 0.1 10*3/uL (ref 0.0–0.5)
Eosinophils Relative: 1 %
HCT: 50.4 % — ABNORMAL HIGH (ref 36.0–46.0)
Hemoglobin: 15.4 g/dL — ABNORMAL HIGH (ref 12.0–15.0)
Immature Granulocytes: 1 %
Lymphocytes Relative: 22 %
Lymphs Abs: 0.9 10*3/uL (ref 0.7–4.0)
MCH: 28.1 pg (ref 26.0–34.0)
MCHC: 30.6 g/dL (ref 30.0–36.0)
MCV: 92 fL (ref 80.0–100.0)
Monocytes Absolute: 0 10*3/uL — ABNORMAL LOW (ref 0.1–1.0)
Monocytes Relative: 1 %
Neutro Abs: 3.2 10*3/uL (ref 1.7–7.7)
Neutrophils Relative %: 74 %
Platelets: 256 10*3/uL (ref 150–400)
RBC: 5.48 MIL/uL — ABNORMAL HIGH (ref 3.87–5.11)
RDW: 13.3 % (ref 11.5–15.5)
WBC: 4.3 10*3/uL (ref 4.0–10.5)
nRBC: 0 % (ref 0.0–0.2)

## 2021-11-19 LAB — BASIC METABOLIC PANEL
Anion gap: 4 — ABNORMAL LOW (ref 5–15)
Anion gap: 4 — ABNORMAL LOW (ref 5–15)
BUN: 11 mg/dL (ref 6–20)
BUN: 12 mg/dL (ref 6–20)
CO2: 16 mmol/L — ABNORMAL LOW (ref 22–32)
CO2: 22 mmol/L (ref 22–32)
Calcium: 5.8 mg/dL — CL (ref 8.9–10.3)
Calcium: 8.7 mg/dL — ABNORMAL LOW (ref 8.9–10.3)
Chloride: 118 mmol/L — ABNORMAL HIGH (ref 98–111)
Chloride: 107 mmol/L (ref 98–111)
Creatinine, Ser: 0.61 mg/dL (ref 0.44–1.00)
Creatinine, Ser: 0.86 mg/dL (ref 0.44–1.00)
GFR, Estimated: >60 mL/min (ref >60)
GFR, Estimated: >60 mL/min (ref >60)
Glucose, Bld: 89 mg/dL (ref 70–99)
Glucose, Bld: 123 mg/dL — ABNORMAL HIGH (ref 70–99)
Potassium: 5.2 mmol/L — ABNORMAL HIGH (ref 3.5–5.1)
Potassium: 5.1 mmol/L (ref 3.5–5.1)
Sodium: 138 mmol/L (ref 135–145)
Sodium: 133 mmol/L — ABNORMAL LOW (ref 135–145)

## 2021-11-19 LAB — HEPATIC FUNCTION PANEL
ALT: 34 U/L (ref 0–44)
AST: 41 U/L (ref 15–41)
Albumin: 3.5 g/dL (ref 3.5–5.0)
Alkaline Phosphatase: 64 U/L (ref 38–126)
Bilirubin, Direct: 0.6 mg/dL — ABNORMAL HIGH (ref 0.0–0.2)
Indirect Bilirubin: 0.3 mg/dL (ref 0.3–0.9)
Total Bilirubin: 0.9 mg/dL (ref 0.3–1.2)
Total Protein: 6.4 g/dL — ABNORMAL LOW (ref 6.5–8.1)

## 2021-11-19 LAB — MAGNESIUM
Magnesium: 1.4 mg/dL — ABNORMAL LOW (ref 1.7–2.4)
Magnesium: 2.6 mg/dL — ABNORMAL HIGH (ref 1.7–2.4)

## 2021-11-19 MED ORDER — MAGNESIUM SULFATE 2 GM/50ML IV SOLN
2.0000 g | Freq: Once | INTRAVENOUS | Status: AC
  Administered 2021-11-19: 14:00:00 2 g via INTRAVENOUS
  Filled 2021-11-19: qty 50

## 2021-11-19 MED ORDER — ONDANSETRON 4 MG PO TBDP
4.0000 mg | ORAL_TABLET | Freq: Once | ORAL | Status: AC
  Administered 2021-11-19: 11:00:00 4 mg via ORAL
  Filled 2021-11-19: qty 1

## 2021-11-19 MED ORDER — CALCIUM GLUCONATE-NACL 1-0.675 GM/50ML-% IV SOLN
1.0000 g | Freq: Once | INTRAVENOUS | Status: AC
  Administered 2021-11-19: 13:00:00 1000 mg via INTRAVENOUS
  Filled 2021-11-19: qty 50

## 2021-11-19 MED ORDER — EPINEPHRINE 0.3 MG/0.3ML IJ SOAJ: 0.3000 mg | INTRAMUSCULAR | 0 refills | Status: AC | PRN

## 2021-11-19 MED ORDER — METHYLPREDNISOLONE ACETATE 40 MG/ML IJ SUSP
40.0000 mg | INTRAMUSCULAR | Status: AC | PRN
Start: 2021-11-19 — End: 2021-11-19
  Administered 2021-11-19: 12:00:00 40 mg via INTRA_ARTICULAR

## 2021-11-19 MED ORDER — LIDOCAINE HCL 1 % IJ SOLN
5.0000 mL | INTRAMUSCULAR | Status: AC | PRN
Start: 2021-11-19 — End: 2021-11-19
  Administered 2021-11-19: 12:00:00 5 mL

## 2021-11-19 MED ORDER — FAMOTIDINE IN NACL 20-0.9 MG/50ML-% IV SOLN
20.0000 mg | Freq: Once | INTRAVENOUS | Status: AC
  Administered 2021-11-19: 11:00:00 20 mg via INTRAVENOUS
  Filled 2021-11-19: qty 50

## 2021-11-19 NOTE — Discharge Instructions (Addendum)
You were treated by EMS for your allergic reaction with benadryl and epinephrine.  Your calcium and magnesium was low in the ED and was repleted while you were here.  You will be sent a prescription for EpiPen.  The attached instructions give directions on how to and when to use the EpiPen.  You may follow-up with your primary care provider as needed.  Return to the emergency department if you are experiencing increasing/worsening chest tightness, shortness of breath, hives, or worsening symptoms.

## 2021-11-19 NOTE — ED Provider Notes (Signed)
°  Physical Exam  BP 103/75    Pulse 94    Temp 98 F (36.7 C) (Oral)    Resp (!) 25    SpO2 93%   Physical Exam  ED Course/Procedures   Clinical Course as of 11/20/21 2236  Tue Nov 19, 2021  1308 Discussed with attending, will supplement calcium. Order pth, ionized calcium, and vitamin d levels. [SB]  6659 Patient reevaluated and notified of treatment plan. Patient agreeable. [SB]  1539 Discussed discharge treatment plan with patient.  Patient agreeable. [SB]    Clinical Course User Index [SB] Blue, Soijett A, PA-C    Procedures  MDM  Patient care assumed from [facility] PA at shift change please see her note for full HPI.  Briefly, patient here status post left knee injection, likely anaphylaxis reaction to Solu-Medrol.  Patient had a diffuse urticarial rash, she is back to baseline now.  She received Benadryl, epinephrine by EMS.  Also given Pepcid by ED provider.  Labs were obtained on arrival, this showed hypokalemia, hypomagnesemia, although electrolyte panel seem to have some derangement, will obtain a repeat of this.  Patient did already receive calcium gluconate, magnesium replacement.  5:22 PM repeat BMP showed a calcium level 8.7, improved likely from placement.  Magnesium level slightly up, likely due to replacement.  Patient is overall without any plaints at this time, maintaining her eyewear adequately.  Her vitals are within normal limits.  Been a prescription for EpiPen by EDP.  Patient stable for discharge.    Portions of this note were generated with [employer]. Dictation errors may occur despite best attempts at proofreading.        Janeece Fitting, PA-C 11/20/21 2236    Isla Pence, MD 11/22/21 [phone]

## 2021-11-19 NOTE — ED Provider Notes (Signed)
[facility] EMERGENCY DEPARTMENT Provider Note   CSN: [id number] Arrival date & time: 11/19/21  1020     History Chief Complaint  Patient presents with   Allergic Reaction    Angela Campos is a 50 y.o. female with no PMHx presents to the ED complaining of allergic reaction onset PTA. Patient reports Angela Campos was at the orthopedist office and Angela Campos is receiving steroid injection to her left knee due to a history of osteoarthritis.  Following administration of the medications, Angela Campos began to experience chest tightness, trouble breathing, nausea, and urticarial rash throughout her body.  EMS was called and patient was given Benadryl and epinephrine while in route to the ED. on arrival to the ED patient reports her chest tightness and shortness of breath have resolved.  Patient denies history of similar symptoms.  Patient is unsure if Angela Campos has received lidocaine or methylprednisolone before in the past.  Denies any new foods, lotions, soaps, detergents, pets, new environment.  Angela Campos denies chest pain, abdominal pain, vomiting, arthralgias, joint swelling, fever, chills.      The history is provided by the EMS personnel and the patient. No language interpreter was used.  Allergic Reaction Presenting symptoms: difficulty breathing, itching and rash   Severity:  Moderate Prior allergic episodes:  No prior episodes Context: medications   Context: not animal exposure, not food allergies and not new detergents/soaps   Relieved by:  Epinephrine and antihistamines Worsened by:  Nothing Ineffective treatments:  None tried     No past medical history on file.  Patient Active Problem List   Diagnosis Date Noted   Status post arthroscopy of right knee 03/04/2018   Unilateral primary osteoarthritis, right knee 03/04/2018   Acute pain of right knee 01/27/2018    No past surgical history on file.   OB History   No obstetric history on file.     No family history on file.  Social  History   Tobacco Use   Smoking status: Never   Smokeless tobacco: Never  Substance Use Topics   Alcohol use: Yes   Drug use: No    Home Medications Prior to Admission medications   Medication Sig Start Date End Date Taking? Authorizing Provider  EPINEPHrine 0.3 mg/0.3 mL IJ SOAJ injection Inject 0.3 mg into the muscle as needed for anaphylaxis. 11/19/21  Yes Paisley Grajeda A, PA-C  ibuprofen (ADVIL) 200 MG tablet Take 400 mg by mouth every 6 (six) hours as needed for mild pain.   Yes [provider]  ipratropium (ATROVENT) 0.06 % nasal spray Place 2 sprays into both nostrils 4 (four) times daily. Patient not taking: Reported on 09/20/2018 05/07/17   Lysbeth Penner, FNP    Allergies    Iodine and Methylprednisolone  Review of Systems   Review of Systems  Constitutional:  Negative for chills and fever.  Respiratory:  Positive for chest tightness (Resolved) and shortness of breath (Resolved).   Cardiovascular:  Negative for chest pain.  Gastrointestinal:  Positive for nausea. Negative for abdominal pain and vomiting.  Musculoskeletal:  Negative for arthralgias and joint swelling.  Skin:  Positive for itching and rash.  All other systems reviewed and are negative.  Physical Exam Updated Vital Signs BP 108/66    Pulse 96    Temp (!) 97.5 F (36.4 C) (Axillary)    Resp 16    SpO2 91%   Physical Exam Vitals and nursing note reviewed.  Constitutional:      General: Angela Campos  is not in acute distress.    Appearance: Angela Campos is not diaphoretic.  HENT:     Head: Normocephalic and atraumatic.     Nose: Nose normal.     Mouth/Throat:     Mouth: Mucous membranes are moist.     Pharynx: Oropharynx is clear. Uvula midline. No pharyngeal swelling, oropharyngeal exudate, posterior oropharyngeal erythema or uvula swelling.     Tonsils: No tonsillar exudate or tonsillar abscesses.     Comments: Mucous membranes moist and clear.  Patent airway, uvula midline.  No posterior pharyngeal  exudate, erythema, swelling, uvula swelling.  No tonsillar exudate, tonsillar abscess.  Tolerating secretions well. Eyes:     General: No scleral icterus.    Conjunctiva/sclera: Conjunctivae normal.  Cardiovascular:     Rate and Rhythm: Normal rate and regular rhythm.     Pulses: Normal pulses.     Heart sounds: Normal heart sounds.  Pulmonary:     Effort: Pulmonary effort is normal. No respiratory distress.     Breath sounds: Normal breath sounds. No wheezing.  Abdominal:     General: Bowel sounds are normal.     Palpations: Abdomen is soft. There is no mass.     Tenderness: There is no abdominal tenderness. There is no guarding or rebound.  Musculoskeletal:        General: Normal range of motion.     Cervical back: Normal range of motion and neck supple.     Comments: Ace wrap in place to left knee.  Skin:    General: Skin is warm and dry.     Findings: Rash present. Rash is urticarial.     Comments: Diffuse urticarial rash noted throughout.  Neurological:     Mental Status: Angela Campos is alert and oriented to person, place, and time.  Psychiatric:        Behavior: Behavior normal.    ED Results / Procedures / Treatments   Labs (all labs ordered are listed, but only abnormal results are displayed) Labs Reviewed  CBC WITH DIFFERENTIAL/PLATELET - Abnormal; Notable for the following components:      Result Value   RBC 5.48 (*)    Hemoglobin 15.4 (*)    HCT 50.4 (*)    Monocytes Absolute 0.0 (*)    All other components within normal limits  BASIC METABOLIC PANEL - Abnormal; Notable for the following components:   Potassium 5.2 (*)    Chloride 118 (*)    CO2 16 (*)    Calcium 5.8 (*)    Anion gap 4 (*)    All other components within normal limits  MAGNESIUM - Abnormal; Notable for the following components:   Magnesium 1.4 (*)    All other components within normal limits  CALCIUM, IONIZED  PARATHYROID HORMONE, INTACT (NO CA)  VITAMIN D 25 HYDROXY (VIT D DEFICIENCY, FRACTURES)   BASIC METABOLIC PANEL  HEPATIC FUNCTION PANEL  MAGNESIUM    EKG EKG Interpretation  Date/Time:  Tuesday November 19 2021 10:37:19 EST Ventricular Rate:  76 PR Interval:  145 QRS Duration: 102 QT Interval:  401 QTC Calculation: 451 R Axis:   109 Text Interpretation: Sinus rhythm Biatrial enlargement Consider right ventricular hypertrophy Confirmed by Regan Lemming ([id number]) on 11/19/2021 10:54:07 AM  Radiology XR Knee 1-2 Views Left  Result Date: 11/19/2021 Left knee 2 views: Shows mild patellofemoral osteophytes.  Mild narrowing medial joint line.  Otherwise knee is well-preserved.  No acute fractures bony abnormalities.  Knee is well located.  XR Knee 1-2  Views Right  Result Date: 11/19/2021 Right knee mild patellofemoral osteophytes.  Bone-on-bone medial compartment.  Lateral compartment overall well-preserved.  No acute fractures bony abnormalities.   Procedures Procedures   Medications Ordered in ED Medications  ondansetron (ZOFRAN-ODT) disintegrating tablet 4 mg (4 mg Oral Given 11/19/21 1045)  famotidine (PEPCID) IVPB 20 mg premix (0 mg Intravenous Stopped 11/19/21 1153)  calcium gluconate 1 g/ 50 mL sodium chloride IVPB (0 mg Intravenous Stopped 11/19/21 1412)  magnesium sulfate IVPB 2 g 50 mL (0 g Intravenous Stopped 11/19/21 1529)    ED Course  I have reviewed the triage vital signs and the nursing notes.  Pertinent labs & imaging results that were available during my care of the patient were reviewed by me and considered in my medical decision making (see chart for details).  Clinical Course as of 11/19/21 1557  Tue Nov 19, 2021  1308 Discussed with attending, will supplement calcium. Order pth, ionized calcium, and vitamin d levels. [SB]  3818 Patient reevaluated and notified of treatment plan. Patient agreeable. [SB]  1539 Discussed discharge treatment plan with patient.  Patient agreeable. [SB]    Clinical Course User Index [SB] Earle Burson A, PA-C    MDM Rules/Calculators/A&P                         Patient presents to the ED with anaphylaxis status post steroid injection to left knee by orthopedist.  Patient given 50 mg p.o. Benadryl and epinephrine in route by EMS prior to arrival.  On arrival to the ED, patient alert and oriented, no respiratory distress, no feeling of throat closing sensation, tolerating secretions well, patent airway, able to speak in complete sentences.  Vital signs stable.  Report from EMS notes patient had 3 body systems involved with her anaphylaxis consistent of mucous membranes, skin and respiratory.  No need for supplemental oxygen at this time.  No need for additional epinephrine at this time.  No concern for airway compromise at this time.  Zofran given with relief of patient's nausea in the ED.  Patient tolerating p.o. intake in the ED.  CBC and BMP obtained.  BMP notable for decreased calcium at 5.8.  Calcium repleted in the ED.  Magnesium noted to be low at 1.4, magnesium repleted in the ED. Patient also given famotidine in the ED.  Case discussed with attending, who recommends further work-up of hypocalcemia in the ED.  Labs ordered with results pending.   Prescription for epinephrine provided.  Discussed likely discharge treatment plan with patient at bedside.  Patient agreeable at this time. Hepatic function panel, vitamin D, PTH, ionized calcium, ordered with results pending at time of sign-out.   Patient case discussed with Janeece Fitting, PA-C at sign-out. Patient care transferred at sign out.  Final Clinical Impression(s) / ED Diagnoses Final diagnoses:  Anaphylaxis, initial encounter    Rx / DC Orders ED Discharge Orders          Ordered    EPINEPHrine 0.3 mg/0.3 mL IJ SOAJ injection  As needed        11/19/21 1538             Juno Bozard A, PA-C 11/19/21 1558    Regan Lemming, MD 11/19/21 1753

## 2021-11-19 NOTE — Progress Notes (Signed)
Office Visit Note   Patient: Angela Campos           Date of Birth: 1971-03-11           MRN: [id number] Visit Date: 11/19/2021              Requested by: No referring provider defined for this encounter. PCP: Patient, No Pcp Per (Inactive)   Assessment & Plan: Visit Diagnoses:  1. Unilateral primary osteoarthritis, right knee   2. Chronic pain of left knee     Plan: Discussed with patient that we will see her back in 2 weeks see how she did overall in regards to the left knee.  Did discuss at length with patient her right knee replacement she voiced that she would like to perform this sometime in June.discussed with her the postoperative protocol risk benefits of total knee arthroplasty.   Unfortunately she did return to the office after going out to the parking lot and had developed hives and some shortness of breath.  She is found to be flushed and sweating.  She was having some tingling in her legs and feet.  Bandage was removed from the left knee and obvious hives.  She did also appear quite flushed.  She was given 50 mg of Benadryl orally.  EMS was called they administered epinephrine by EMS.  She was transported in stable condition to the ER.  .  Follow-Up Instructions: Return in about 2 weeks (around 12/03/2021).   Orders:  Orders Placed This Encounter  Procedures   XR Knee 1-2 Views Right   XR Knee 1-2 Views Left   No orders of the defined types were placed in this encounter.     Procedures: Large Joint Inj: L knee on 11/19/2021 11:56 AM Indications: pain Details: 22 G 1.5 in needle, anterolateral approach  Arthrogram: No  Medications: 5 mL lidocaine 1 %; 40 mg methylPREDNISolone acetate 40 MG/ML Aspirate: 12 mL yellow Outcome: tolerated well, no immediate complications Procedure, treatment alternatives, risks and benefits explained, specific risks discussed. Consent was given by the patient. Immediately prior to procedure a time out was called to verify the  correct patient, procedure, equipment, support staff and site/side marked as required. Patient was prepped and draped in the usual sterile fashion.      Clinical Data: No additional findings.   Subjective: Chief Complaint  Patient presents with   Left Knee - Pain   Right Knee - Pain    HPI Angela Campos is a 50 year old female well-known to our department service has not been seen since 2019.  Comes in today with bilateral knee pain.  She has been managing end-stage arthritis of her right knee.  She is wishing to discuss right total knee replacement however she wants this to be performed in June of this year.  She has failed conservative treatment on the right knee.  She did have a fall due to the right knee giving way in September of this year and feels that she may have injured her left knee.  She is having increased left knee pain since that incident.  She feels that she may have torn her left knee meniscus as she has had 2 meniscus tears and 2 arthroscopies on the right knee in the past.  She has tried Advil and Voltaren gel.  She does not tolerate bracing of either knee well.  She notes that the left knee gives way on her at times.  Otherwise no mechanical symptoms.  She  has had prior cortisone injections in the right knee and these are not beneficial at this point time.  Prior MRI of the right knee showed areas of full-thickness cartilage loss involving the patellofemoral compartment medial compartment and partial cartilage loss involving the lateral compartment.  Review of Systems See HPI otherwise negative or noncontributory.  Objective: Vital Signs: Ht 5\' 7"  (1.702 m)    Wt 245 lb 9.6 oz (111.4 kg)    BMI 38.47 kg/m   Physical Exam Constitutional:      Appearance: She is not ill-appearing or diaphoretic.  Pulmonary:     Effort: Pulmonary effort is normal.  Neurological:     Mental Status: She is alert and oriented to person, place, and time.  Psychiatric:        Mood and  Affect: Mood normal.    Ortho Exam Bilateral knees good range of motion of both knees.  No gross instability valgus varus stressing of either knee.  Right knee is significant patellofemoral crepitus.  Tenderness along medial joint line.  Left knee slight effusion.  No abnormal warmth erythema left knee. Specialty Comments:  No specialty comments available.  Imaging: XR Knee 1-2 Views Left  Result Date: 11/19/2021 Left knee 2 views: Shows mild patellofemoral osteophytes.  Mild narrowing medial joint line.  Otherwise knee is well-preserved.  No acute fractures bony abnormalities.  Knee is well located.  XR Knee 1-2 Views Right  Result Date: 11/19/2021 Right knee mild patellofemoral osteophytes.  Bone-on-bone medial compartment.  Lateral compartment overall well-preserved.  No acute fractures bony abnormalities.    PMFS History: Patient Active Problem List   Diagnosis Date Noted   Status post arthroscopy of right knee 03/04/2018   Unilateral primary osteoarthritis, right knee 03/04/2018   Acute pain of right knee 01/27/2018   History reviewed. No pertinent past medical history.  History reviewed. No pertinent family history.  History reviewed. No pertinent surgical history. Social History   Occupational History   Not on file  Tobacco Use   Smoking status: Never   Smokeless tobacco: Never  Substance and Sexual Activity   Alcohol use: Yes   Drug use: No   Sexual activity: Not on file

## 2021-11-19 NOTE — ED Triage Notes (Signed)
BIB [facility] after staff called to report pt having increase hives/nausea post lidocaine and depo shot to the left knee. Pt given 50 mg PO. 0.3 mg epi given enroute. PA Blue at bedside.

## 2021-11-19 NOTE — ED Notes (Signed)
Patient verbalizes understanding of discharge instructions. Prescriptions reviewed. Opportunity for questioning and answers were provided. Armband removed by staff, pt discharged from ED ambulatory.

## 2021-11-20 ENCOUNTER — Telehealth: Payer: Self-pay

## 2021-11-20 NOTE — Telephone Encounter (Signed)
I called and checked on the pt. She stated she is doing better. Pt was advised to go a see a allergist to see exactly what she is allergic to. She stated understanding to this.

## 2021-12-09 ENCOUNTER — Encounter: Payer: Self-pay | Admitting: Physician Assistant

## 2021-12-09 ENCOUNTER — Other Ambulatory Visit: Payer: Self-pay

## 2021-12-09 ENCOUNTER — Ambulatory Visit (INDEPENDENT_AMBULATORY_CARE_PROVIDER_SITE_OTHER): Payer: BC Managed Care – PPO | Admitting: Physician Assistant

## 2021-12-09 DIAGNOSIS — G8929 Other chronic pain: Secondary | ICD-10-CM

## 2021-12-09 DIAGNOSIS — M25562 Pain in left knee: Secondary | ICD-10-CM | POA: Diagnosis not present

## 2021-12-09 MED ORDER — DIAZEPAM 5 MG PO TABS: ORAL_TABLET | ORAL | 0 refills | Status: DC

## 2021-12-09 NOTE — Addendum Note (Signed)
Addended by: Robyne Peers on: 12/09/2021 03:16 PM   Modules accepted: Orders

## 2021-12-09 NOTE — Progress Notes (Signed)
HPI: [name] returns today status post left knee injection.  Unfortunately she had anaphylactic reaction to the left knee.  She tolerated injections in the past.  She is given Benadryl epinephrine prior to going to the ER by EMS.  She reports that she was there is some 8 hours.  She was noted to have hypocalcemia.  She was given prescription for epinephrine.  She states that the cortisone injection really gave her no relief left knee.  Again she had some mild arthritic changes in the left knee.  She is having pain mostly the medial aspect of the knee. She has known right knee end-stage medial compartmental arthritis with mild to moderate tricompartmental arthritis otherwise.  Physical exam: General well-developed well-nourished female no acute distress.  Ambulates without any assistive device. Left knee: Full extension.  Flexion.  Tenderness along medial joint line no effusion abnormal warmth erythema.  No instability valgus varus stressing.  McMurray's is negative.  Impression: Left knee pain  Plan: We will send her for an MRI of the left knee to rule out meniscal tear given the fact that she is having medial compartmental changes and minimal arthritic findings on x-ray.  She has failed conservative treatment with time, quad strengthening exercises and cortisone injection.  She will follow-up with an allergist for work-up of possible cause of the anaphylactic reaction.  She is given Valium to take prior to the MRI of the left knee we will do an open MRI to evaluate for meniscal tear.  Follow-up after the MRI to go over results discuss further treatment.  She is still planning to schedule right total knee arthroplasty in the near future.

## 2022-01-01 ENCOUNTER — Ambulatory Visit
Admission: RE | Admit: 2022-01-01 | Discharge: 2022-01-01 | Disposition: A | Discharge status: Home / Self Care | Payer: BC Managed Care – PPO | Source: Ambulatory Visit | Attending: Physician Assistant | Admitting: Physician Assistant

## 2022-01-01 ENCOUNTER — Other Ambulatory Visit: Payer: Self-pay

## 2022-01-01 DIAGNOSIS — G8929 Other chronic pain: Secondary | ICD-10-CM

## 2022-01-06 ENCOUNTER — Encounter: Payer: Self-pay | Admitting: Orthopaedic Surgery

## 2022-01-06 ENCOUNTER — Ambulatory Visit (INDEPENDENT_AMBULATORY_CARE_PROVIDER_SITE_OTHER): Payer: BC Managed Care – PPO | Admitting: Orthopaedic Surgery

## 2022-01-06 ENCOUNTER — Other Ambulatory Visit: Payer: Self-pay

## 2022-01-06 DIAGNOSIS — G8929 Other chronic pain: Secondary | ICD-10-CM | POA: Diagnosis not present

## 2022-01-06 DIAGNOSIS — M1711 Unilateral primary osteoarthritis, right knee: Secondary | ICD-10-CM | POA: Diagnosis not present

## 2022-01-06 DIAGNOSIS — M25561 Pain in right knee: Secondary | ICD-10-CM

## 2022-01-06 DIAGNOSIS — M25562 Pain in left knee: Secondary | ICD-10-CM | POA: Diagnosis not present

## 2022-01-06 NOTE — Progress Notes (Signed)
The patient is well-known to [place].  She does have severe arthritis in her right knee and we are discussing knee replacement surgery for sometime in May or June.  This is for the right knee.  However we are seeing her today for her left knee to go over MRI of the left knee.  She has had a mechanical fall as it relates to her right knee and she has been dealing with left knee pain but the x-rays of the left knee show well-maintained joint space.  She did have a steroid injection in the left knee that unfortunately caused an anaphylactic response which took her to the emergency room.  There may have been a supplement or an additive with an injection that caused this.  She had had steroid injection successfully in the past without any type of response.  She is looking to see an allergist to deal with that issue.  The MRI of her left knee shows some moderate cartilage thinning in the medial compartment of the knee and the patellofemoral joint but no full-thickness cartilage loss.  There is no meniscal tearing but just some degenerative signal changes in the meniscus.  At this point I will just have her offload her left knee and hold off any other injections based on her last response.  We will see her back in about 2 months and at that point go over knee replacement surgery in detail for right knee and work on getting that scheduled.  No x-rays are needed at her next visit.

## 2022-03-05 ENCOUNTER — Ambulatory Visit (INDEPENDENT_AMBULATORY_CARE_PROVIDER_SITE_OTHER): Payer: BC Managed Care – PPO | Admitting: Orthopaedic Surgery

## 2022-03-05 ENCOUNTER — Encounter: Payer: Self-pay | Admitting: Orthopaedic Surgery

## 2022-03-05 DIAGNOSIS — M1711 Unilateral primary osteoarthritis, right knee: Secondary | ICD-10-CM | POA: Diagnosis not present

## 2022-03-05 NOTE — Progress Notes (Signed)
? ?Office Visit Note ?  ?Patient: Angela Campos           ?Date of Birth: Apr 15, 1971           ?MRN: [id number] ?Visit Date: 03/05/2022 ?             ?Requested by: No referring provider defined for this encounter. ?PCP: Patient, No Pcp Per (Inactive) ? ? ?Assessment & Plan: ?Visit Diagnoses:  ?1. Unilateral primary osteoarthritis, right knee   ? ? ?Plan: Impression bone-on-bone medial compartment right knee failed conservative treatment.  Recommend right total knee arthroplasty.  Risk benefits of surgery discussed with patient at length by Dr. Ninfa Linden and myself.  Risk include but are not limited to wound healing problems, nerve/vessel injury, blood loss, prolonged pain, and DVT/PE.  We will work on scheduling right total knee for sometime in mid to late May.  She will follow-up 2 weeks postop. ? ?Follow-Up Instructions: Return 2 weeks post op.  ? ?Orders:  ?No orders of the defined types were placed in this encounter. ? ?No orders of the defined types were placed in this encounter. ? ? ? ? Procedures: ?No procedures performed ? ? ?Clinical Data: ?No additional findings. ? ? ?Subjective: ?Chief Complaint  ?Patient presents with  ? Right Knee - Follow-up  ? Left Knee - Follow-up  ? ? ?HPI ?Angela Campos returns today for her right knee arthritis.  She is here to discuss knee replacement.  She has had no new injury to the knee.  She continues to have severe knee pain.  Again she is on is failed conservative treatment and actually had an adverse reaction to cortisone injection and had to be transported from our office to the ER.  Radiographs of her right knee show bone-on-bone medial compartment and osteophytes involving the patellofemoral joint.  Lateral compartment overall well-preserved.  Left knee shows some moderate cartilage thinning involving the medial compartment and the patellofemoral joint but no full-thickness cartilage loss.  Both knees are affecting quality of life.  She would like to proceed with a right total  knee replacement in the near future. ? ?Review of Systems  ?Constitutional:  Negative for chills and fever.  ?Respiratory:  Negative for shortness of breath.   ?Cardiovascular:  Negative for chest pain.  ? ? ?Objective: ?Vital Signs: There were no vitals taken for this visit. ? ?Physical Exam ?Constitutional:   ?   Appearance: She is not ill-appearing or diaphoretic.  ?Pulmonary:  ?   Effort: Pulmonary effort is normal.  ?Neurological:  ?   Mental Status: She is alert and oriented to person, place, and time.  ?Psychiatric:     ?   Mood and Affect: Mood normal.  ? ? ?Ortho Exam ?She ambulates without any assistive device.  Good range of motion of the right knee. ? ?Specialty Comments:  ?No specialty comments available. ? ?Imaging: ?No results found. ? ? ?PMFS History: ?Patient Active Problem List  ? Diagnosis Date Noted  ? Status post arthroscopy of right knee 03/04/2018  ? Unilateral primary osteoarthritis, right knee 03/04/2018  ? Acute pain of right knee 01/27/2018  ? ?History reviewed. No pertinent past medical history.  ?History reviewed. No pertinent family history.  ?History reviewed. No pertinent surgical history. ?Social History  ? ?Occupational History  ? Not on file  ?Tobacco Use  ? Smoking status: Never  ? Smokeless tobacco: Never  ?Substance and Sexual Activity  ? Alcohol use: Yes  ? Drug use: No  ? Sexual  activity: Not on file  ? ? ? ? ? ? ?

## 2022-03-25 ENCOUNTER — Other Ambulatory Visit: Payer: Self-pay

## 2022-04-22 ENCOUNTER — Other Ambulatory Visit: Payer: Self-pay | Admitting: Physician Assistant

## 2022-04-22 DIAGNOSIS — M1711 Unilateral primary osteoarthritis, right knee: Secondary | ICD-10-CM

## 2022-04-23 NOTE — Pre-Procedure Instructions (Signed)
Surgical Instructions    Your procedure is scheduled on Thursday, June 1st.  Report to [facility] Main Entrance "A" at 05:30 A.M., then check in with the Admitting office.  Call this number if you have problems the morning of surgery:  [phone]   If you have any questions prior to your surgery date call [phone]: Open Monday-Friday 8am-4pm    Remember:  Do not eat after midnight the night before your surgery  You may drink clear liquids until 04:30 AM the morning of your surgery.   Clear liquids allowed are: Water, Non-Citrus Juices (without pulp), Carbonated Beverages, Clear Tea, Black Coffee Only (NO MILK, CREAM OR POWDERED CREAMER of any kind), and Gatorade.   Patient Instructions  The night before surgery:  No food after midnight. ONLY clear liquids after midnight  The day of surgery (if you do NOT have diabetes):  Drink ONE (1) Pre-Surgery Clear Ensure by 04:30 AM the morning of surgery. Drink in one sitting. Do not sip.  This drink was given to you during your hospital  pre-op appointment visit.  Nothing else to drink after completing the  Pre-Surgery Clear Ensure.          If you have questions, please contact your surgeon's office.     Take these medicines the morning of surgery with A SIP OF WATER: None    As of today, STOP taking any Aspirin (unless otherwise instructed by your surgeon) Aleve, Naproxen, Ibuprofen, Motrin, Advil, Goody's, BC's, all herbal medications, fish oil, and all vitamins.                     Do NOT Smoke (Tobacco/Vaping) for 24 hours prior to your procedure.  If you use a CPAP at night, you may bring your mask/headgear for your overnight stay.   Contacts, glasses, piercing's, hearing aid's, dentures or partials may not be worn into surgery, please bring cases for these belongings.    For patients admitted to the hospital, discharge time will be determined by your treatment team.   Patients discharged the day of surgery will not  be allowed to drive home, and someone needs to stay with them for 24 hours.  SURGICAL WAITING ROOM VISITATION Patients having surgery or a procedure may have two support people in the waiting room. These visitors may be switched out with other visitors if needed. Children under the age of 49 must have an adult accompany them who is not the patient. If the patient needs to stay at the hospital during part of their recovery, the visitor guidelines for inpatient rooms apply.  Please refer to the [facility] website for the visitor guidelines for Inpatients (after your surgery is over and you are in a regular room).    Special instructions:   [facility]- Preparing For Surgery  Before surgery, you can play an important role. Because skin is not sterile, your skin needs to be as free of germs as possible. You can reduce the number of germs on your skin by washing with CHG (chlorahexidine gluconate) Soap before surgery.  CHG is an antiseptic cleaner which kills germs and bonds with the skin to continue killing germs even after washing.    Oral Hygiene is also important to reduce your risk of infection.  Remember - BRUSH YOUR TEETH THE MORNING OF SURGERY WITH YOUR REGULAR TOOTHPASTE  Please do not use if you have an allergy to CHG or antibacterial soaps. If your skin becomes reddened/irritated stop using the CHG.  Do not shave (including legs and underarms) for at least 48 hours prior to first CHG shower. It is OK to shave your face.  Please follow these instructions carefully.   Shower the NIGHT BEFORE SURGERY and the MORNING OF SURGERY  If you chose to wash your hair, wash your hair first as usual with your normal shampoo.  After you shampoo, rinse your hair and body thoroughly to remove the shampoo.  Use CHG Soap as you would any other liquid soap. You can apply CHG directly to the skin and wash gently with a scrungie or a clean washcloth.   Apply the CHG Soap to your body ONLY FROM THE NECK  DOWN.  Do not use on open wounds or open sores. Avoid contact with your eyes, ears, mouth and genitals (private parts). Wash Face and genitals (private parts)  with your normal soap.   Wash thoroughly, paying special attention to the area where your surgery will be performed.  Thoroughly rinse your body with warm water from the neck down.  DO NOT shower/wash with your normal soap after using and rinsing off the CHG Soap.  Pat yourself dry with a CLEAN TOWEL.  Wear CLEAN PAJAMAS to bed the night before surgery  Place CLEAN SHEETS on your bed the night before your surgery  DO NOT SLEEP WITH PETS.   Day of Surgery: Take a shower with CHG soap. Do not wear jewelry or makeup Do not wear lotions, powders, perfumes, or deodorant. Do not shave 48 hours prior to surgery.  Do not bring valuables to the hospital.  [facility] is not responsible for any belongings or valuables. Do not wear nail polish, gel polish, artificial nails, or any other type of covering on natural nails (fingers and toes) If you have artificial nails or gel coating that need to be removed by a nail salon, please have this removed prior to surgery. Artificial nails or gel coating may interfere with anesthesia's ability to adequately monitor your vital signs. Wear Clean/Comfortable clothing the morning of surgery Remember to brush your teeth WITH YOUR REGULAR TOOTHPASTE.   Please read over the following fact sheets that you were given.    If you received a COVID test during your pre-op visit  it is requested that you wear a mask when out in public, stay away from anyone that may not be feeling well and notify your surgeon if you develop symptoms. If you have been in contact with anyone that has tested positive in the last 10 days please notify you surgeon.

## 2022-04-24 ENCOUNTER — Encounter (HOSPITAL_COMMUNITY): Payer: Self-pay

## 2022-04-24 ENCOUNTER — Encounter (HOSPITAL_COMMUNITY)
Admission: RE | Admit: 2022-04-24 | Discharge: 2022-04-24 | Disposition: A | Discharge status: Home / Self Care | Payer: BC Managed Care – PPO | Source: Ambulatory Visit | Attending: Orthopaedic Surgery | Admitting: Orthopaedic Surgery

## 2022-04-24 ENCOUNTER — Other Ambulatory Visit: Payer: Self-pay

## 2022-04-24 VITALS — BP 139/80 | HR 86 | Temp 98.1°F | Resp 17 | Ht 67.0 in | Wt 231.5 lb

## 2022-04-24 DIAGNOSIS — M1711 Unilateral primary osteoarthritis, right knee: Secondary | ICD-10-CM | POA: Diagnosis not present

## 2022-04-24 DIAGNOSIS — Z01818 Encounter for other preprocedural examination: Secondary | ICD-10-CM

## 2022-04-24 DIAGNOSIS — Z01812 Encounter for preprocedural laboratory examination: Secondary | ICD-10-CM | POA: Insufficient documentation

## 2022-04-24 LAB — SURGICAL PCR SCREEN
MRSA, PCR: NEGATIVE
Staphylococcus aureus: NEGATIVE

## 2022-04-24 LAB — TYPE AND SCREEN
ABO/RH(D): O POS
Antibody Screen: NEGATIVE

## 2022-04-24 LAB — CBC
HCT: 44 % (ref 36.0–46.0)
Hemoglobin: 14.5 g/dL (ref 12.0–15.0)
MCH: 28.9 pg (ref 26.0–34.0)
MCHC: 33 g/dL (ref 30.0–36.0)
MCV: 87.6 fL (ref 80.0–100.0)
Platelets: 243 10*3/uL (ref 150–400)
RBC: 5.02 MIL/uL (ref 3.87–5.11)
RDW: 13 % (ref 11.5–15.5)
WBC: 9.3 10*3/uL (ref 4.0–10.5)
nRBC: 0 % (ref 0.0–0.2)

## 2022-04-24 NOTE — Progress Notes (Signed)
PCP - denies Cardiologist - denies  PPM/ICD - denies   Chest x-ray - 05/07/17 EKG - 11/19/21 Stress Test - denies ECHO - denies Cardiac Cath - denies  Sleep Study - denies   DM- denies  ASA/Blood Thinner Instructions: n/a   ERAS Protcol - yes PRE-SURGERY Ensure given at PAT  COVID TEST- n/a   Anesthesia review: no  Patient denies shortness of breath, fever, cough and chest pain at PAT appointment   All instructions explained to the patient, with a verbal understanding of the material. Patient agrees to go over the instructions while at home for a better understanding. Patient also instructed to notify surgeon of any contact with COVID+ person or if she develops any symptoms. The opportunity to ask questions was provided.

## 2022-04-30 NOTE — Anesthesia Preprocedure Evaluation (Signed)
Anesthesia Evaluation  Patient identified by MRN, date of birth, ID band Patient awake    Reviewed: Allergy & Precautions, NPO status , Patient's Chart, lab work & pertinent test results  Airway Mallampati: II  TM Distance: >3 FB Neck ROM: Full    Dental no notable dental hx.    Pulmonary neg pulmonary ROS,    Pulmonary exam normal breath sounds clear to auscultation       Cardiovascular Exercise Tolerance: Good negative cardio ROS Normal cardiovascular exam Rhythm:Regular Rate:Normal     Neuro/Psych negative neurological ROS  negative psych ROS   GI/Hepatic negative GI ROS, Neg liver ROS,   Endo/Other  Morbid obesity  Renal/GU negative Renal ROS  negative genitourinary   Musculoskeletal  (+) Arthritis ,   Abdominal   Peds negative pediatric ROS (+)  Hematology negative hematology ROS (+)   Anesthesia Other Findings   Reproductive/Obstetrics negative OB ROS                            Anesthesia Physical Anesthesia Plan  ASA: 2  Anesthesia Plan: Regional and General   Post-op Pain Management: Tylenol PO (pre-op)* and Regional block*   Induction: Intravenous  PONV Risk Score and Plan: 3 and Propofol infusion, TIVA, Ondansetron, Dexamethasone, Midazolam and Treatment may vary due to age or medical condition  Airway Management Planned: Natural Airway and Simple Face Mask  Additional Equipment: None  Intra-op Plan:   Post-operative Plan: Extubation in OR  Informed Consent: I have reviewed the patients History and Physical, chart, labs and discussed the procedure including the risks, benefits and alternatives for the proposed anesthesia with the patient or authorized representative who has indicated his/her understanding and acceptance.     Dental advisory given  Plan Discussed with: CRNA, Anesthesiologist and Surgeon  Anesthesia Plan Comments:        Anesthesia Quick  Evaluation

## 2022-05-01 ENCOUNTER — Observation Stay (HOSPITAL_COMMUNITY): Payer: BC Managed Care – PPO

## 2022-05-01 ENCOUNTER — Encounter (HOSPITAL_COMMUNITY): Payer: Self-pay | Admitting: Orthopaedic Surgery

## 2022-05-01 ENCOUNTER — Ambulatory Visit (HOSPITAL_COMMUNITY): Payer: BC Managed Care – PPO | Admitting: Anesthesiology

## 2022-05-01 ENCOUNTER — Encounter (HOSPITAL_COMMUNITY)
Admission: RE | Disposition: A | Discharge status: Home Health | Payer: Self-pay | Source: Home / Self Care | Attending: Orthopaedic Surgery

## 2022-05-01 ENCOUNTER — Other Ambulatory Visit: Payer: Self-pay

## 2022-05-01 ENCOUNTER — Observation Stay (HOSPITAL_COMMUNITY)
Admission: RE | Admit: 2022-05-01 | Discharge: 2022-05-03 | Disposition: A | Discharge status: Home Health | Payer: BC Managed Care – PPO | Attending: Orthopaedic Surgery | Admitting: Orthopaedic Surgery

## 2022-05-01 DIAGNOSIS — Z96651 Presence of right artificial knee joint: Secondary | ICD-10-CM

## 2022-05-01 DIAGNOSIS — M1711 Unilateral primary osteoarthritis, right knee: Secondary | ICD-10-CM | POA: Diagnosis present

## 2022-05-01 DIAGNOSIS — M179 Osteoarthritis of knee, unspecified: Secondary | ICD-10-CM | POA: Diagnosis present

## 2022-05-01 HISTORY — PX: TOTAL KNEE ARTHROPLASTY: SHX125

## 2022-05-01 LAB — ABO/RH: ABO/RH(D): O POS

## 2022-05-01 LAB — POCT PREGNANCY, URINE: Preg Test, Ur: NEGATIVE

## 2022-05-01 SURGERY — ARTHROPLASTY, KNEE, TOTAL
Anesthesia: Regional | Site: Knee | Laterality: Right

## 2022-05-01 MED ORDER — METOCLOPRAMIDE HCL 5 MG/ML IJ SOLN: 5.0000 mg | Freq: Three times a day (TID) | INTRAMUSCULAR | Status: DC | PRN

## 2022-05-01 MED ORDER — ALUM & MAG HYDROXIDE-SIMETH 200-200-20 MG/5ML PO SUSP: 30.0000 mL | ORAL | Status: DC | PRN

## 2022-05-01 MED ORDER — STERILE WATER FOR IRRIGATION IR SOLN
Status: DC | PRN
  Administered 2022-05-01: 1000 mL

## 2022-05-01 MED ORDER — ONDANSETRON HCL 4 MG/2ML IJ SOLN
4.0000 mg | Freq: Four times a day (QID) | INTRAMUSCULAR | Status: DC | PRN
  Administered 2022-05-01 – 2022-05-02 (×3): 4 mg via INTRAVENOUS
  Filled 2022-05-01 (×3): qty 2

## 2022-05-01 MED ORDER — TRANEXAMIC ACID-NACL 1000-0.7 MG/100ML-% IV SOLN
INTRAVENOUS | Status: AC
  Filled 2022-05-01: qty 200

## 2022-05-01 MED ORDER — KETOROLAC TROMETHAMINE 15 MG/ML IJ SOLN
7.5000 mg | Freq: Four times a day (QID) | INTRAMUSCULAR | Status: AC
  Administered 2022-05-01 – 2022-05-02 (×3): 7.5 mg via INTRAVENOUS
  Filled 2022-05-01 (×3): qty 1

## 2022-05-01 MED ORDER — PANTOPRAZOLE SODIUM 40 MG PO TBEC
40.0000 mg | DELAYED_RELEASE_TABLET | Freq: Every day | ORAL | Status: DC
  Administered 2022-05-01 – 2022-05-03 (×3): 40 mg via ORAL
  Filled 2022-05-01 (×3): qty 1

## 2022-05-01 MED ORDER — BUPIVACAINE HCL (PF) 0.5 % IJ SOLN
INTRAMUSCULAR | Status: DC | PRN
  Administered 2022-05-01: 30 mL

## 2022-05-01 MED ORDER — OXYCODONE HCL 5 MG PO TABS
5.0000 mg | ORAL_TABLET | ORAL | Status: DC | PRN
  Administered 2022-05-01 – 2022-05-03 (×8): 10 mg via ORAL
  Filled 2022-05-01 (×8): qty 2

## 2022-05-01 MED ORDER — LACTATED RINGERS IV SOLN: INTRAVENOUS | Status: DC

## 2022-05-01 MED ORDER — MIDAZOLAM HCL 2 MG/2ML IJ SOLN
INTRAMUSCULAR | Status: DC | PRN
  Administered 2022-05-01: 2 mg via INTRAVENOUS

## 2022-05-01 MED ORDER — FENTANYL CITRATE (PF) 100 MCG/2ML IJ SOLN
INTRAMUSCULAR | Status: AC
  Filled 2022-05-01: qty 2

## 2022-05-01 MED ORDER — CEFAZOLIN SODIUM-DEXTROSE 2-4 GM/100ML-% IV SOLN
2.0000 g | INTRAVENOUS | Status: AC
  Administered 2022-05-01: 2 g via INTRAVENOUS
  Filled 2022-05-01: qty 100

## 2022-05-01 MED ORDER — HYDROMORPHONE HCL 2 MG PO TABS
2.0000 mg | ORAL_TABLET | ORAL | Status: DC | PRN
  Administered 2022-05-02 – 2022-05-03 (×3): 2 mg via ORAL
  Filled 2022-05-01 (×3): qty 1

## 2022-05-01 MED ORDER — CEFAZOLIN SODIUM-DEXTROSE 1-4 GM/50ML-% IV SOLN
1.0000 g | Freq: Four times a day (QID) | INTRAVENOUS | Status: AC
  Administered 2022-05-01 (×2): 1 g via INTRAVENOUS
  Filled 2022-05-01 (×3): qty 50

## 2022-05-01 MED ORDER — MIDAZOLAM HCL 2 MG/2ML IJ SOLN
INTRAMUSCULAR | Status: AC
  Filled 2022-05-01: qty 2

## 2022-05-01 MED ORDER — ASPIRIN 81 MG PO CHEW
81.0000 mg | CHEWABLE_TABLET | Freq: Two times a day (BID) | ORAL | Status: DC
  Administered 2022-05-01 – 2022-05-03 (×4): 81 mg via ORAL
  Filled 2022-05-01 (×4): qty 1

## 2022-05-01 MED ORDER — ACETAMINOPHEN 325 MG PO TABS
325.0000 mg | ORAL_TABLET | Freq: Four times a day (QID) | ORAL | Status: DC | PRN
  Administered 2022-05-03: 650 mg via ORAL
  Filled 2022-05-01: qty 2

## 2022-05-01 MED ORDER — LIDOCAINE 2% (20 MG/ML) 5 ML SYRINGE
INTRAMUSCULAR | Status: AC
  Filled 2022-05-01: qty 5

## 2022-05-01 MED ORDER — LIDOCAINE 2% (20 MG/ML) 5 ML SYRINGE
INTRAMUSCULAR | Status: DC | PRN
  Administered 2022-05-01: 20 mg via INTRAVENOUS

## 2022-05-01 MED ORDER — ACETAMINOPHEN 500 MG PO TABS
1000.0000 mg | ORAL_TABLET | Freq: Once | ORAL | Status: AC
  Administered 2022-05-01: 1000 mg via ORAL
  Filled 2022-05-01: qty 2

## 2022-05-01 MED ORDER — ONDANSETRON HCL 4 MG/2ML IJ SOLN: 4.0000 mg | Freq: Once | INTRAMUSCULAR | Status: DC | PRN

## 2022-05-01 MED ORDER — PROPOFOL 10 MG/ML IV BOLUS
INTRAVENOUS | Status: DC | PRN
  Administered 2022-05-01 (×2): 50 mg via INTRAVENOUS

## 2022-05-01 MED ORDER — ONDANSETRON HCL 4 MG PO TABS: 4.0000 mg | ORAL_TABLET | Freq: Four times a day (QID) | ORAL | Status: DC | PRN

## 2022-05-01 MED ORDER — HYDROMORPHONE HCL 1 MG/ML IJ SOLN
1.0000 mg | INTRAMUSCULAR | Status: DC | PRN
  Administered 2022-05-01 – 2022-05-03 (×6): 1 mg via INTRAVENOUS
  Filled 2022-05-01 (×6): qty 1

## 2022-05-01 MED ORDER — KETOROLAC TROMETHAMINE 15 MG/ML IJ SOLN
15.0000 mg | Freq: Once | INTRAMUSCULAR | Status: AC
  Administered 2022-05-01: 15 mg via INTRAVENOUS
  Filled 2022-05-01: qty 1

## 2022-05-01 MED ORDER — PHENOL 1.4 % MT LIQD: 1.0000 | OROMUCOSAL | Status: DC | PRN

## 2022-05-01 MED ORDER — FENTANYL CITRATE (PF) 250 MCG/5ML IJ SOLN
INTRAMUSCULAR | Status: DC | PRN
  Administered 2022-05-01 (×2): 50 ug via INTRAVENOUS

## 2022-05-01 MED ORDER — METOCLOPRAMIDE HCL 5 MG PO TABS: 5.0000 mg | ORAL_TABLET | Freq: Three times a day (TID) | ORAL | Status: DC | PRN

## 2022-05-01 MED ORDER — ORAL CARE MOUTH RINSE: 15.0000 mL | Freq: Once | OROMUCOSAL | Status: AC

## 2022-05-01 MED ORDER — MENTHOL 3 MG MT LOZG: 1.0000 | LOZENGE | OROMUCOSAL | Status: DC | PRN

## 2022-05-01 MED ORDER — 0.9 % SODIUM CHLORIDE (POUR BTL) OPTIME
TOPICAL | Status: DC | PRN
  Administered 2022-05-01: 1000 mL

## 2022-05-01 MED ORDER — FENTANYL CITRATE (PF) 100 MCG/2ML IJ SOLN
25.0000 ug | INTRAMUSCULAR | Status: DC | PRN
  Administered 2022-05-01 (×3): 50 ug via INTRAVENOUS

## 2022-05-01 MED ORDER — ONDANSETRON HCL 4 MG/2ML IJ SOLN
INTRAMUSCULAR | Status: AC
  Filled 2022-05-01: qty 2

## 2022-05-01 MED ORDER — PROPOFOL 10 MG/ML IV BOLUS
INTRAVENOUS | Status: AC
  Filled 2022-05-01: qty 20

## 2022-05-01 MED ORDER — BUPIVACAINE IN DEXTROSE 0.75-8.25 % IT SOLN
INTRATHECAL | Status: DC | PRN
  Administered 2022-05-01: 1.6 mL via INTRATHECAL

## 2022-05-01 MED ORDER — TRANEXAMIC ACID-NACL 1000-0.7 MG/100ML-% IV SOLN
1000.0000 mg | INTRAVENOUS | Status: AC
  Administered 2022-05-01: 1000 mg via INTRAVENOUS
  Filled 2022-05-01: qty 100

## 2022-05-01 MED ORDER — OXYCODONE HCL 5 MG/5ML PO SOLN
5.0000 mg | Freq: Once | ORAL | Status: AC | PRN
  Administered 2022-05-01: 5 mg via ORAL

## 2022-05-01 MED ORDER — PROPOFOL 500 MG/50ML IV EMUL
INTRAVENOUS | Status: DC | PRN
  Administered 2022-05-01: 50 ug/kg/min via INTRAVENOUS

## 2022-05-01 MED ORDER — OXYCODONE HCL 5 MG/5ML PO SOLN
ORAL | Status: AC
  Filled 2022-05-01: qty 5

## 2022-05-01 MED ORDER — OXYCODONE HCL 5 MG PO TABS: 5.0000 mg | ORAL_TABLET | Freq: Once | ORAL | Status: AC | PRN

## 2022-05-01 MED ORDER — FENTANYL CITRATE (PF) 250 MCG/5ML IJ SOLN
INTRAMUSCULAR | Status: AC
  Filled 2022-05-01: qty 5

## 2022-05-01 MED ORDER — MUPIROCIN 2 % EX OINT: 1.0000 "application " | TOPICAL_OINTMENT | Freq: Once | CUTANEOUS | Status: DC

## 2022-05-01 MED ORDER — ONDANSETRON HCL 4 MG/2ML IJ SOLN
INTRAMUSCULAR | Status: DC | PRN
  Administered 2022-05-01: 4 mg via INTRAVENOUS

## 2022-05-01 MED ORDER — DIPHENHYDRAMINE HCL 12.5 MG/5ML PO ELIX: 12.5000 mg | ORAL_SOLUTION | ORAL | Status: DC | PRN

## 2022-05-01 MED ORDER — CHLORHEXIDINE GLUCONATE 0.12 % MT SOLN
15.0000 mL | Freq: Once | OROMUCOSAL | Status: AC
  Administered 2022-05-01: 15 mL via OROMUCOSAL
  Filled 2022-05-01: qty 15

## 2022-05-01 MED ORDER — DOCUSATE SODIUM 100 MG PO CAPS
100.0000 mg | ORAL_CAPSULE | Freq: Two times a day (BID) | ORAL | Status: DC
  Administered 2022-05-01 – 2022-05-03 (×5): 100 mg via ORAL
  Filled 2022-05-01 (×5): qty 1

## 2022-05-01 MED ORDER — POLYETHYLENE GLYCOL 3350 17 G PO PACK: 17.0000 g | PACK | Freq: Every day | ORAL | Status: DC | PRN

## 2022-05-01 MED ORDER — AMISULPRIDE (ANTIEMETIC) 5 MG/2ML IV SOLN: 10.0000 mg | Freq: Once | INTRAVENOUS | Status: DC | PRN

## 2022-05-01 MED ORDER — TIZANIDINE HCL 4 MG PO TABS
4.0000 mg | ORAL_TABLET | Freq: Four times a day (QID) | ORAL | Status: DC | PRN
Start: 2022-05-01 — End: 2022-05-03
  Administered 2022-05-02: 4 mg via ORAL
  Filled 2022-05-01: qty 1

## 2022-05-01 MED ORDER — SODIUM CHLORIDE 0.9 % IV SOLN: INTRAVENOUS | Status: DC

## 2022-05-01 SURGICAL SUPPLY — 71 items
BAG COUNTER SPONGE SURGICOUNT (BAG) ×2 IMPLANT
BANDAGE ESMARK 6X9 LF (GAUZE/BANDAGES/DRESSINGS) ×1 IMPLANT
BLADE SAG 18X100X1.27 (BLADE) ×2 IMPLANT
BLADE SAW SGTL 83.5X18.5 (BLADE) ×1 IMPLANT
BNDG ELASTIC 6X5.8 VLCR STR LF (GAUZE/BANDAGES/DRESSINGS) ×3 IMPLANT
BNDG ESMARK 6X9 LF (GAUZE/BANDAGES/DRESSINGS) ×4
BOWL SMART MIX CTS (DISPOSABLE) ×2 IMPLANT
COMP FEM PS KNEE NRW 8 RT (Joint) ×2 IMPLANT
COMP TIB PS KNEE 0D E RT (Joint) ×2 IMPLANT
COMPONENT FEM PS KNEE NRW 8 RT (Joint) IMPLANT
COMPONET TIB PS KNEE 0D E RT (Joint) IMPLANT
COOLER ICEMAN CLASSIC (MISCELLANEOUS) ×1 IMPLANT
COVER SURGICAL LIGHT HANDLE (MISCELLANEOUS) ×2 IMPLANT
CUFF TOURN SGL QUICK 34 (TOURNIQUET CUFF) ×2
CUFF TOURN SGL QUICK 42 (TOURNIQUET CUFF) IMPLANT
CUFF TRNQT CYL 34X4.125X (TOURNIQUET CUFF) ×1 IMPLANT
DRAPE EXTREMITY T 121X128X90 (DISPOSABLE) ×2 IMPLANT
DRAPE HALF SHEET 40X57 (DRAPES) ×2 IMPLANT
DRAPE U-SHAPE 47X51 STRL (DRAPES) ×2 IMPLANT
DRSG PAD ABDOMINAL 8X10 ST (GAUZE/BANDAGES/DRESSINGS) ×2 IMPLANT
DURAPREP 26ML APPLICATOR (WOUND CARE) ×2 IMPLANT
ELECT CAUTERY BLADE 6.4 (BLADE) ×2 IMPLANT
ELECT REM PT RETURN 9FT ADLT (ELECTROSURGICAL) ×2
ELECTRODE REM PT RTRN 9FT ADLT (ELECTROSURGICAL) ×1 IMPLANT
FACESHIELD WRAPAROUND (MASK) ×4 IMPLANT
FACESHIELD WRAPAROUND OR TEAM (MASK) ×2 IMPLANT
GAUZE SPONGE 4X4 12PLY STRL (GAUZE/BANDAGES/DRESSINGS) ×2 IMPLANT
GAUZE XEROFORM 1X8 LF (GAUZE/BANDAGES/DRESSINGS) ×2 IMPLANT
GLOVE BIOGEL PI IND STRL 8 (GLOVE) ×2 IMPLANT
GLOVE BIOGEL PI INDICATOR 8 (GLOVE) ×2
GLOVE ORTHO TXT STRL SZ7.5 (GLOVE) ×2 IMPLANT
GLOVE SURG ORTHO 8.0 STRL STRW (GLOVE) ×2 IMPLANT
GOWN STRL REUS W/ TWL LRG LVL3 (GOWN DISPOSABLE) IMPLANT
GOWN STRL REUS W/ TWL XL LVL3 (GOWN DISPOSABLE) ×2 IMPLANT
GOWN STRL REUS W/TWL LRG LVL3 (GOWN DISPOSABLE)
GOWN STRL REUS W/TWL XL LVL3 (GOWN DISPOSABLE) ×4
HANDPIECE INTERPULSE COAX TIP (DISPOSABLE) ×2
HDLS TROCR DRIL PIN KNEE 75 (PIN) ×2
IMMOBILIZER KNEE 22 UNIV (SOFTGOODS) ×2 IMPLANT
IMPL PATELLA METAL SZ32X10 (Joint) ×1 IMPLANT
INSERT TIBIA KNEE RIGHT 10 (Joint) ×1 IMPLANT
KIT BASIN OR (CUSTOM PROCEDURE TRAY) ×2 IMPLANT
KIT TURNOVER KIT B (KITS) ×2 IMPLANT
MANIFOLD NEPTUNE II (INSTRUMENTS) ×2 IMPLANT
NDL 18GX1X1/2 (RX/OR ONLY) (NEEDLE) IMPLANT
NEEDLE 18GX1X1/2 (RX/OR ONLY) (NEEDLE) IMPLANT
NS IRRIG 1000ML POUR BTL (IV SOLUTION) ×2 IMPLANT
PACK TOTAL JOINT (CUSTOM PROCEDURE TRAY) ×2 IMPLANT
PAD ARMBOARD 7.5X6 YLW CONV (MISCELLANEOUS) ×2 IMPLANT
PAD COLD SHLDR WRAP-ON (PAD) ×1 IMPLANT
PADDING CAST COTTON 6X4 STRL (CAST SUPPLIES) ×3 IMPLANT
PIN DRILL HDLS TROCAR 75 4PK (PIN) IMPLANT
SCREW FEMALE HEX FIX 25X2.5 (ORTHOPEDIC DISPOSABLE SUPPLIES) ×1 IMPLANT
SET HNDPC FAN SPRY TIP SCT (DISPOSABLE) ×1 IMPLANT
SET PAD KNEE POSITIONER (MISCELLANEOUS) ×2 IMPLANT
STAPLER VISISTAT 35W (STAPLE) IMPLANT
STRIP CLOSURE SKIN 1/2X4 (GAUZE/BANDAGES/DRESSINGS) IMPLANT
SUCTION FRAZIER HANDLE 10FR (MISCELLANEOUS) ×2
SUCTION TUBE FRAZIER 10FR DISP (MISCELLANEOUS) ×1 IMPLANT
SUT MNCRL AB 4-0 PS2 18 (SUTURE) IMPLANT
SUT VIC AB 0 CT1 27 (SUTURE) ×2
SUT VIC AB 0 CT1 27XBRD ANBCTR (SUTURE) ×1 IMPLANT
SUT VIC AB 1 CT1 27 (SUTURE) ×6
SUT VIC AB 1 CT1 27XBRD ANBCTR (SUTURE) ×2 IMPLANT
SUT VIC AB 2-0 CT1 27 (SUTURE) ×4
SUT VIC AB 2-0 CT1 TAPERPNT 27 (SUTURE) ×2 IMPLANT
SYR 50ML LL SCALE MARK (SYRINGE) IMPLANT
TOWEL GREEN STERILE (TOWEL DISPOSABLE) ×2 IMPLANT
TOWEL GREEN STERILE FF (TOWEL DISPOSABLE) ×2 IMPLANT
TRAY CATH 16FR W/PLASTIC CATH (SET/KITS/TRAYS/PACK) IMPLANT
WRAP KNEE MAXI GEL POST OP (GAUZE/BANDAGES/DRESSINGS) ×2 IMPLANT

## 2022-05-01 NOTE — Evaluation (Signed)
Physical Therapy Evaluation Patient Details Name: Angela Campos MRN: [id number] DOB: [birth year] Today's Date: 05/01/2022  History of Present Illness  Pt is a 51 y/o female s/p R TKA. No pertinent PMH.  Clinical Impression  Pt is s/p surgery above with deficits below. Pt limited this session secondary to nausea. Able to stand and take side steps at EOB with min guard A and use of RW. Educated about knee precautions. Reports husband and daughter can assist as needed at d/c. Will continue to follow acutely.        Recommendations for follow up therapy are one component of a multi-disciplinary discharge planning process, led by the attending physician.  Recommendations may be updated based on patient status, additional functional criteria and insurance authorization.  Follow Up Recommendations Follow physician's recommendations for discharge plan and follow up therapies    Assistance Recommended at Discharge Intermittent Supervision/Assistance  Patient can return home with the following  Help with stairs or ramp for entrance;Assist for transportation;Assistance with cooking/housework    Equipment Recommendations Rolling walker (2 wheels)  Recommendations for Other Services       Functional Status Assessment Patient has had a recent decline in their functional status and demonstrates the ability to make significant improvements in function in a reasonable and predictable amount of time.     Precautions / Restrictions Precautions Precautions: Knee Precaution Booklet Issued: No Precaution Comments: Verbally reviewed knee precautions. Restrictions Weight Bearing Restrictions: Yes RLE Weight Bearing: Weight bearing as tolerated      Mobility  Bed Mobility Overal bed mobility: Needs Assistance Bed Mobility: Supine to Sit, Sit to Supine     Supine to sit: Min assist Sit to supine: Min assist   General bed mobility comments: Assist for RLE management throughout     Transfers Overall transfer level: Needs assistance Equipment used: Rolling walker (2 wheels) Transfers: Sit to/from Stand Sit to Stand: Min guard           General transfer comment: Min guard for safety. Cues for hand placement. Able to take side steps at EOB, however, further mobiltiy limited secondary to nausea.    Ambulation/Gait                  Stairs            Wheelchair Mobility    Modified Rankin (Stroke Patients Only)       Balance Overall balance assessment: Needs assistance Sitting-balance support: No upper extremity supported, Feet supported Sitting balance-Leahy Scale: Fair     Standing balance support: Bilateral upper extremity supported Standing balance-Leahy Scale: Poor Standing balance comment: Reliant on BUE support                             Pertinent Vitals/Pain Pain Assessment Pain Assessment: Faces Faces Pain Scale: Hurts even more Pain Location: R knee Pain Descriptors / Indicators: Grimacing, Guarding Pain Intervention(s): Limited activity within patient's tolerance, Monitored during session, Repositioned    Home Living Family/patient expects to be discharged to:: Private residence Living Arrangements: Spouse/significant other Available Help at Discharge: Family;Available 24 hours/day Type of Home: House Home Access: Stairs to enter Entrance Stairs-Rails: None Entrance Stairs-Number of Steps: 3   Home Layout: Two level;Able to live on main level with bedroom/bathroom Home Equipment: BSC/3in1      Prior Function Prior Level of Function : Independent/Modified Independent  Hand Dominance        Extremity/Trunk Assessment   Upper Extremity Assessment Upper Extremity Assessment: Overall WFL for tasks assessed    Lower Extremity Assessment Lower Extremity Assessment: RLE deficits/detail RLE Deficits / Details: Deficits consistent with post op pain and weakness.     Cervical / Trunk Assessment Cervical / Trunk Assessment: Normal  Communication   Communication: No difficulties  Cognition Arousal/Alertness: Awake/alert Behavior During Therapy: WFL for tasks assessed/performed Overall Cognitive Status: Within Functional Limits for tasks assessed                                          General Comments      Exercises     Assessment/Plan    PT Assessment Patient needs continued PT services  PT Problem List Decreased strength;Decreased range of motion;Decreased balance;Decreased activity tolerance;Decreased mobility;Decreased knowledge of use of DME;Decreased knowledge of precautions;Pain       PT Treatment Interventions DME instruction;Gait training;Stair training;Functional mobility training;Therapeutic exercise;Therapeutic activities;Balance training;Patient/family education    PT Goals (Current goals can be found in the Care Plan section)  Acute Rehab PT Goals Patient Stated Goal: to decrease pain PT Goal Formulation: With patient Time For Goal Achievement: 05/15/22 Potential to Achieve Goals: Good    Frequency 7X/week     Co-evaluation               AM-PAC PT "6 Clicks" Mobility  Outcome Measure Help needed turning from your back to your side while in a flat bed without using bedrails?: A Little Help needed moving from lying on your back to sitting on the side of a flat bed without using bedrails?: A Little Help needed moving to and from a bed to a chair (including a wheelchair)?: A Little Help needed standing up from a chair using your arms (e.g., wheelchair or bedside chair)?: A Little Help needed to walk in hospital room?: A Little Help needed climbing 3-5 steps with a railing? : A Lot 6 Click Score: 17    End of Session Equipment Utilized During Treatment: Gait belt;Right knee immobilizer Activity Tolerance: Treatment limited secondary to medical complications (Comment) (nausea) Patient left: in  bed;with call bell/phone within reach;with family/visitor present Nurse Communication: Mobility status PT Visit Diagnosis: Other abnormalities of gait and mobility (R26.89);Pain Pain - Right/Left: Right Pain - part of body: Knee    Time: 6599-3570 PT Time Calculation (min) (ACUTE ONLY): 17 min   Charges:   PT Evaluation $PT Eval Low Complexity: 1 Low          Lou Miner, DPT  Acute Rehabilitation Services  Office: [phone]   Rudean Hitt 05/01/2022, 4:32 PM

## 2022-05-01 NOTE — Anesthesia Postprocedure Evaluation (Signed)
Anesthesia Post Note  Patient: JYLLIAN HAYNIE  Procedure(s) Performed: RIGHT TOTAL KNEE ARTHROPLASTY (Right: Knee)     Patient location during evaluation: PACU Anesthesia Type: Regional and Spinal Level of consciousness: oriented and awake and alert Pain management: pain level controlled Vital Signs Assessment: post-procedure vital signs reviewed and stable Respiratory status: spontaneous breathing and respiratory function stable Cardiovascular status: blood pressure returned to baseline and stable Postop Assessment: no headache, no backache, no apparent nausea or vomiting and patient able to bend at knees Anesthetic complications: no   No notable events documented.  Last Vitals:  Vitals:   05/01/22 1105 05/01/22 1120  BP: (!) 145/85 139/68  Pulse: 62 65  Resp: 18 15  Temp:    SpO2: 98% 97%    Last Pain:  Vitals:   05/01/22 1120  TempSrc:   PainSc: 3                  Candra R Miley Lindon

## 2022-05-01 NOTE — Brief Op Note (Signed)
05/01/2022  9:03 AM  PATIENT:  Angela Campos  51 y.o. female  PRE-OPERATIVE DIAGNOSIS:  right knee osteoarthritis  POST-OPERATIVE DIAGNOSIS:  right knee osteoarthritis  PROCEDURE:  Procedure(s): RIGHT TOTAL KNEE ARTHROPLASTY (Right)  SURGEON:  Surgeon(s) and Role:    Mcarthur Rossetti, MD - Primary  PHYSICIAN ASSISTANT:  Benita Stabile, PA-C  ANESTHESIA:   regional and spinal  EBL:  50 mL   COUNTS:  YES  TOURNIQUET:   Total Tourniquet Time Documented: Thigh (Right) - 46 minutes Total: Thigh (Right) - 46 minutes   DICTATION: .Other Dictation: Dictation Number [id number]  PLAN OF CARE: Admit for overnight observation  PATIENT DISPOSITION:  PACU - hemodynamically stable.   Delay start of Pharmacological VTE agent (>24hrs) due to surgical blood loss or risk of bleeding: no

## 2022-05-01 NOTE — Transfer of Care (Signed)
Immediate Anesthesia Transfer of Care Note  Patient: Angela Campos  Procedure(s) Performed: RIGHT TOTAL KNEE ARTHROPLASTY (Right: Knee)  Patient Location: PACU  Anesthesia Type:GA combined with regional for post-op pain  Level of Consciousness: awake and patient cooperative  Airway & Oxygen Therapy: Patient Spontanous Breathing  Post-op Assessment: Report given to RN and Post -op Vital signs reviewed and stable  Post vital signs: Reviewed and stable  Last Vitals:  Vitals Value Taken Time  BP    Temp    Pulse 70 05/01/22 0923  Resp 11 05/01/22 0923  SpO2 98 % 05/01/22 0923  Vitals shown include unvalidated device data.  Last Pain:  Vitals:   05/01/22 0616  TempSrc:   PainSc: 0-No pain         Complications: No notable events documented.

## 2022-05-01 NOTE — Anesthesia Procedure Notes (Addendum)
Spinal  Patient location during procedure: OR Start time: 05/01/2022 7:30 AM End time: 05/01/2022 7:40 AM Reason for block: surgical anesthesia Staffing Performed: anesthesiologist  Anesthesiologist: Merlinda Frederick, MD Preanesthetic Checklist Completed: patient identified, IV checked, risks and benefits discussed, surgical consent, monitors and equipment checked, pre-op evaluation and timeout performed Spinal Block Patient position: sitting Prep: DuraPrep Patient monitoring: cardiac monitor, continuous pulse ox and blood pressure Approach: midline Location: L3-4 Injection technique: single-shot Needle Needle type: Pencan  Needle gauge: 24 G Needle length: 9 cm Assessment Events: CSF return Additional Notes Functioning IV was confirmed and monitors were applied. Sterile prep and drape, including hand hygiene and sterile gloves were used. The patient was positioned and the spine was prepped. The skin was anesthetized with lidocaine.  Free flow of clear CSF was obtained prior to injecting local anesthetic into the CSF.  The spinal needle aspirated freely following injection.  The needle was carefully withdrawn.  The patient tolerated the procedure well.

## 2022-05-01 NOTE — Op Note (Signed)
NAME: Angela Campos, Angela Campos MEDICAL RECORD NO: [id number] ACCOUNT NO: [id number] DATE OF BIRTH: 11-22-1971 FACILITY: [facility] LOCATION: [facility] PHYSICIAN: Lind Guest. Ninfa Linden, MD  Operative Report   DATE OF PROCEDURE: 05/01/2022  PREOPERATIVE DIAGNOSIS:  Primary osteoarthritis and degenerative joint disease, right knee.  POSTOPERATIVE DIAGNOSIS:  Primary osteoarthritis and degenerative joint disease, right knee.  PROCEDURE:  Right total knee arthroplasty.  IMPLANTS:  Biomet/Zimmer Persona press-fit knee system with size 8 narrow porous CR femur, size E right tibial tray, 10 mm medial congruent, right polyethylene insert, 32 mm press-fit patellar button.  SURGEON:  Lind Guest. Ninfa Linden, MD  ASSISTANT:  Benita Stabile, PA-C.  ANESTHESIA:   1.  Right lower extremity adductor canal block. 2.  Spinal.  ANTIBIOTICS:  2 g IV Ancef.  TOURNIQUET TIME:  Less than 1 hour.  BLOOD LOSS:  Less than 100 mL.  COMPLICATIONS:  None.  INDICATIONS:  The patient is a 51 year old female who unfortunately has debilitating arthritis involving her right knee that does involve all three compartments.  This has been confirmed on clinical exam as well as x-ray and MRI.  She has a remote  history over a decade ago of her right knee arthroscopy with partial medial meniscectomy and an injury to the knee.  Over time, she has worsened pain with her right knee.  At this point, it is detrimentally affecting her mobility, her activities of daily  living and her quality of life.  She has failed all forms of conservative treatment and wishes to proceed with a knee replacement and we have recommended this as well based on all of the above.  We did describe in detail the risk of acute blood loss  anemia, nerve or vessel injury, fracture, infection, DVT, implant failure and skin and soft tissue issues.  We talked about our goals being decreased pain, improve mobility and overall improve quality of life.  DESCRIPTION OF  PROCEDURE:  After informed consent was obtained, appropriate right knee was marked.  Anesthesia was obtained in the right lower extremity adductor canal block in the holding room.  She was then brought to the operating room and sat up on  the operating table where spinal anesthesia was obtained.  A nonsterile tourniquet was placed around her upper right thigh and her right thigh, knee, leg and ankle as well as foot were prepped and draped with DuraPrep and sterile drapes including a  sterile stockinette.  A timeout was called and she was identified as correct patient, correct right knee.  We then used Esmarch to wrap that leg and tourniquet was inflated to 300 mm of pressure.  We then made a direct midline incision over the knee and  carried this proximally and distally.  We dissected down the knee joint, carried out a medial parapatellar arthrotomy, finding a large joint effusion and significant arthritis in all 3 compartments of the knee.  With the knee in a flexed position, we  removed remnants of the ACL, medial and lateral meniscus and osteophytes in all three compartments.  We then set our extramedullary cutting guide for making our proximal tibia cut, taking 2 mm off the low side and 10 mm off the high side.  We corrected  for a 3-degree slope in neutral varus and valgus.  We then backed this down just 2 more millimeters.  We then went to the femur and used the intramedullary guide for distal femoral cut, setting this for a right knee at 5 degrees externally rotated.  We  did  a 10 mm distal femoral cut off of this and brought the knee back down to full extension with a 10 mm extension block and achieved full extension.  We then went back to the femur and put our femoral sizing guide based off the epicondylar axis and  Whitesides line.  Based off of this, we chose a size 8 femur.  We put a 4-in-1 cutting block for an 8 femur, we made our anterior and posterior cuts, followed by our chamfer cuts.  We  then went back to the tibia and chose a size E right tibial tray for  coverage of the tibial plateau.  We set the rotation off the tibial tubercle and the femur.  Given her good quality bone and her young age, we felt that press-fit implants were appropriate.  We then prepared the tibia for a press-fit tibia tray.  Once  the tray was in placed in terms of the trial tray, we trialed a 10 mm medial congruent, right polyethylene insert.  We were pleased with range of motion and stability without insert.  We then made our patellar cut and drilled 1-hole for a single  press-fit patellar button.  With all trial instrumentation in the knee, we put her through several cycles of motion.  I was pleased with range of motion and stability.  We then removed all trial instrumentation in the knee. We irrigated the knee with  normal saline solution using pulsatile lavage.  We then dried the knee real well and placed our real Biomet/Zimmer Persona press-fit tibial tray, size E for right knee.  We then placed our narrow 8 right press-fit femur.  We placed our 10 mm medial  congruent polyethylene insert and press fit our size 32 patellar button.  With all trial instrumentation, the knee again put it through range of motion.  I was pleased with range of motion and stability.  We then let the tourniquet down.  Hemostasis was  obtained with electrocautery.  We closed the arthrotomy with interrupted #1 Vicryl suture followed by 0 Vicryl to close the deep tissue and 2-0 Vicryl to close subcutaneous tissue.  The skin was closed with staples.  A well-padded sterile dressing was  applied.  She was taken to recovery room in stable condition with all final counts being correct.  No complications noted.  Of note, Benita Stabile, PA-C, assisted during the entire case from beginning to end.  His assistance was medically necessary and  crucial for helping retract soft tissues and guiding implant placement.  He was directly involved with a  layered closure of the wound.   PUS D: 05/01/2022 9:02:02 am T: 05/01/2022 11:22:00 am  JOB: [id number]/ [id number]

## 2022-05-01 NOTE — H&P (Signed)
TOTAL KNEE ADMISSION H&P  Patient is being admitted for right total knee arthroplasty.  Subjective:  Chief Complaint:right knee pain.  HPI: Angela Campos, 51 y.o. female, has a history of pain and functional disability in the right knee due to arthritis and has failed non-surgical conservative treatments for greater than 12 weeks to includeNSAID's and/or analgesics, corticosteriod injections, viscosupplementation injections, flexibility and strengthening excercises, weight reduction as appropriate, and activity modification.  Onset of symptoms was gradual, starting 4 years ago with gradually worsening course since that time. The patient noted no past surgery on the right knee(s).  Patient currently rates pain in the right knee(s) at 10 out of 10 with activity. Patient has night pain, worsening of pain with activity and weight bearing, pain that interferes with activities of daily living, pain with passive range of motion, crepitus, and joint swelling.  Patient has evidence of subchondral sclerosis, periarticular osteophytes, and joint space narrowing by imaging studies. There is no active infection. The patient has had arthroscopic surgery on her right knee for a meniscal tear and plica in 9735.  Patient Active Problem List   Diagnosis Date Noted   Status post arthroscopy of right knee 03/04/2018   Unilateral primary osteoarthritis, right knee 03/04/2018   Acute pain of right knee 01/27/2018   History reviewed. No pertinent past medical history.  Past Surgical History:  Procedure Laterality Date   CESAREAN SECTION     1999 and 2001   DILATION AND CURETTAGE OF UTERUS  2001   KNEE CARTILAGE SURGERY Right    due to meniscus tears   TOOTH EXTRACTION      Current Facility-Administered Medications  Medication Dose Route Frequency Provider Last Rate Last Admin   ceFAZolin (ANCEF) IVPB 2g/100 mL premix  2 g Intravenous On Call to OR Pete Pelt, PA-C       lactated ringers infusion    Intravenous Continuous Santa Lighter, MD       tranexamic acid (CYKLOKAPRON) IVPB 1,000 mg  1,000 mg Intravenous To OR Pete Pelt, PA-C       Allergies  Allergen Reactions   Iodine Anaphylaxis   Methylprednisolone Hives, Shortness Of Breath and Swelling    Social History   Tobacco Use   Smoking status: Never   Smokeless tobacco: Never  Substance Use Topics   Alcohol use: Yes    Alcohol/week: 3.0 - 4.0 standard drinks    Types: 3 - 4 Glasses of wine per week    History reviewed. No pertinent family history.   Review of Systems  Musculoskeletal:  Positive for joint swelling.  All other systems reviewed and are negative.  Objective:  Physical Exam Vitals reviewed.  Constitutional:      Appearance: Normal appearance.  HENT:     Head: Normocephalic and atraumatic.  Eyes:     Extraocular Movements: Extraocular movements intact.     Pupils: Pupils are equal, round, and reactive to light.  Cardiovascular:     Rate and Rhythm: Normal rate and regular rhythm.     Pulses: Normal pulses.  Pulmonary:     Effort: Pulmonary effort is normal.     Breath sounds: Normal breath sounds.  Abdominal:     Palpations: Abdomen is soft.  Musculoskeletal:     Cervical back: Normal range of motion and neck supple.     Right knee: Effusion, bony tenderness and crepitus present. Decreased range of motion. Tenderness present over the medial joint line, lateral joint line and patellar tendon.  Abnormal alignment and abnormal meniscus.  Neurological:     Mental Status: She is alert and oriented to person, place, and time.  Psychiatric:        Behavior: Behavior normal.    Vital signs in last 24 hours: Temp:  [98.1 F (36.7 C)] 98.1 F (36.7 C) (06/01 0611) Pulse Rate:  [88] 88 (06/01 0611) Resp:  [18] 18 (06/01 0611) BP: (139)/(72) 139/72 (06/01 0611) SpO2:  [98 %] 98 % (06/01 0611) Weight:  [104.3 kg] 104.3 kg (06/01 0611)  Labs:   Estimated body mass index is 36.02 kg/m as  calculated from the following:   Height as of this encounter: '5\' 7"'$  (1.702 m).   Weight as of this encounter: 104.3 kg.   Imaging Review Plain radiographs demonstrate severe degenerative joint disease of the right knee(s). The overall alignment ismild varus. The bone quality appears to be excellent for age and reported activity level.      Assessment/Plan:  End stage arthritis, right knee   The patient history, physical examination, clinical judgment of the provider and imaging studies are consistent with end stage degenerative joint disease of the right knee(s) and total knee arthroplasty is deemed medically necessary. The treatment options including medical management, injection therapy arthroscopy and arthroplasty were discussed at length. The risks and benefits of total knee arthroplasty were presented and reviewed. The risks due to aseptic loosening, infection, stiffness, patella tracking problems, thromboembolic complications and other imponderables were discussed. The patient acknowledged the explanation, agreed to proceed with the plan and consent was signed. Patient is being admitted for inpatient treatment for surgery, pain control, PT, OT, prophylactic antibiotics, VTE prophylaxis, progressive ambulation and ADL's and discharge planning. The patient is planning to be discharged home with home health services

## 2022-05-01 NOTE — Anesthesia Procedure Notes (Signed)
Date/Time: 05/01/2022 7:30 AM Performed by: Michele Rockers, CRNA Pre-anesthesia Checklist: Patient identified, Emergency Drugs available, Suction available, Timeout performed and Patient being monitored Patient Re-evaluated:Patient Re-evaluated prior to induction Oxygen Delivery Method: Simple face mask

## 2022-05-01 NOTE — Plan of Care (Signed)

## 2022-05-01 NOTE — Plan of Care (Signed)

## 2022-05-01 NOTE — Anesthesia Procedure Notes (Signed)
Anesthesia Regional Block: Adductor canal block   Pre-Anesthetic Checklist: , timeout performed,  Correct Patient, Correct Site, Correct Laterality,  Correct Procedure, Correct Position, site marked,  Risks and benefits discussed,  Surgical consent,  Pre-op evaluation,  At surgeon's request and post-op pain management  Laterality: Right  Prep: chloraprep       Needles:  Injection technique: Single-shot  Needle Type: Echogenic Stimulator Needle     Needle Length: 10cm  Needle Gauge: 20     Additional Needles:   Procedures:,,,, ultrasound used (permanent image in chart),,    Narrative:  Start time: 05/01/2022 7:05 AM End time: 05/01/2022 7:10 AM Injection made incrementally with aspirations every 5 mL.  Performed by: Personally  Anesthesiologist: Merlinda Frederick, MD  Additional Notes: Functioning IV was confirmed and monitors were applied.  Sterile prep and drape,hand hygiene and sterile gloves were used. Ultrasound guidance: relevant anatomy identified, needle position confirmed, local anesthetic spread visualized around nerve(s)., vascular puncture avoided. Negative aspiration and negative test dose prior to incremental administration of local anesthetic. The patient tolerated the procedure well.

## 2022-05-02 DIAGNOSIS — M1711 Unilateral primary osteoarthritis, right knee: Secondary | ICD-10-CM | POA: Diagnosis not present

## 2022-05-02 LAB — BASIC METABOLIC PANEL
Anion gap: 7 (ref 5–15)
BUN: 12 mg/dL (ref 6–20)
CO2: 24 mmol/L (ref 22–32)
Calcium: 8.8 mg/dL — ABNORMAL LOW (ref 8.9–10.3)
Chloride: 104 mmol/L (ref 98–111)
Creatinine, Ser: 0.74 mg/dL (ref 0.44–1.00)
GFR, Estimated: >60 mL/min (ref >60)
Glucose, Bld: 132 mg/dL — ABNORMAL HIGH (ref 70–99)
Potassium: 4.2 mmol/L (ref 3.5–5.1)
Sodium: 135 mmol/L (ref 135–145)

## 2022-05-02 MED ORDER — HYDROMORPHONE HCL 4 MG PO TABS: 4.0000 mg | ORAL_TABLET | ORAL | 0 refills | Status: DC | PRN

## 2022-05-02 MED ORDER — GABAPENTIN 100 MG PO CAPS
100.0000 mg | ORAL_CAPSULE | Freq: Three times a day (TID) | ORAL | Status: DC
  Administered 2022-05-02 – 2022-05-03 (×3): 100 mg via ORAL
  Filled 2022-05-02 (×3): qty 1

## 2022-05-02 MED ORDER — GABAPENTIN 100 MG PO CAPS: 100.0000 mg | ORAL_CAPSULE | Freq: Three times a day (TID) | ORAL | 1 refills | Status: DC

## 2022-05-02 MED ORDER — ASPIRIN 81 MG PO CHEW: 81.0000 mg | CHEWABLE_TABLET | Freq: Two times a day (BID) | ORAL | 0 refills | Status: DC

## 2022-05-02 MED ORDER — TIZANIDINE HCL 4 MG PO TABS: 4.0000 mg | ORAL_TABLET | Freq: Four times a day (QID) | ORAL | 0 refills | Status: AC | PRN

## 2022-05-02 MED ORDER — ONDANSETRON HCL 4 MG PO TABS: 4.0000 mg | ORAL_TABLET | Freq: Three times a day (TID) | ORAL | 0 refills | Status: AC | PRN

## 2022-05-02 NOTE — Progress Notes (Signed)
Physical Therapy Treatment Patient Details Name: Angela Campos MRN: [id number] DOB: 1971/05/18 Today's Date: 05/02/2022   History of Present Illness Pt is a 51 y/o female s/p R TKA. No pertinent PMH.    PT Comments    The pt was agreeable to session, states she has kept pillow out from under her leg this afternoon. Continues to report pain and is limited by poor ROM and pain at this time. The pt and her spouse were educated in [place] and given handout. The pt requires physical assist to complete most exercises, limited by poor quad activation and limited ROM due to LE swelling and pain.  She was able to progress ambulation distance with improved wt bearing on RLE, will continue to benefit from skilled PT to progress functional ROM, strength, and endurance for mobility at home.    Recommendations for follow up therapy are one component of a multi-disciplinary discharge planning process, led by the attending physician.  Recommendations may be updated based on patient status, additional functional criteria and insurance authorization.  Follow Up Recommendations  Follow physician's recommendations for discharge plan and follow up therapies     Assistance Recommended at Discharge Intermittent Supervision/Assistance  Patient can return home with the following Help with stairs or ramp for entrance;Assist for transportation;Assistance with cooking/housework;A little help with walking and/or transfers;A little help with bathing/dressing/bathroom   Equipment Recommendations  Rolling walker (2 wheels)    Recommendations for Other Services       Precautions / Restrictions Precautions Precautions: Knee Precaution Booklet Issued: Yes (comment) Precaution Comments: Verbally reviewed knee precautions. Restrictions Weight Bearing Restrictions: Yes RLE Weight Bearing: Weight bearing as tolerated     Mobility  Bed Mobility Overal bed mobility: Needs Assistance Bed Mobility: Sit to Supine, Supine to  Sit     Supine to sit: Min assist Sit to supine: Min assist   General bed mobility comments: Assist for RLE management throughout    Transfers Overall transfer level: Needs assistance Equipment used: Rolling walker (2 wheels) Transfers: Sit to/from Stand Sit to Stand: Min guard           General transfer comment: significant increased time to rise and lower, cues for RLE extension.    Ambulation/Gait Ambulation/Gait assistance: Min guard Gait Distance (Feet): 75 Feet Assistive device: Rolling walker (2 wheels) Gait Pattern/deviations: Step-to pattern, Decreased stance time - right, Decreased weight shift to right, Shuffle, Step-through pattern Gait velocity: decreased Gait velocity interpretation: <1.31 ft/sec, indicative of household ambulator   General Gait Details: slowed and dependent on BUE support to limite wt bearing on RLE.        Balance Overall balance assessment: Needs assistance Sitting-balance support: No upper extremity supported, Feet supported Sitting balance-Leahy Scale: Fair     Standing balance support: Bilateral upper extremity supported Standing balance-Leahy Scale: Poor Standing balance comment: Reliant on BUE support                            Cognition Arousal/Alertness: Awake/alert Behavior During Therapy: WFL for tasks assessed/performed Overall Cognitive Status: Within Functional Limits for tasks assessed                                 General Comments: pt self-limiting due to pain at times, benefits from encouragement and increased time. verbalized understanding        Exercises Total Joint Exercises Quad Sets: AROM,  Right, 10 reps, Supine Heel Slides: AAROM, Right, 10 reps, Supine Hip ABduction/ADduction: AAROM, Right, 5 reps, Supine Knee Flexion: AAROM, Right, 10 reps, Seated Goniometric ROM: 25 deg - 60 deg    General Comments        Pertinent Vitals/Pain Pain Assessment Pain Assessment:  0-10 Faces Pain Scale: Hurts whole lot Pain Location: R knee Pain Descriptors / Indicators: Grimacing, Guarding Pain Intervention(s): Limited activity within patient's tolerance, Monitored during session, Premedicated before session, Repositioned, Ice applied     PT Goals (current goals can now be found in the care plan section) Acute Rehab PT Goals Patient Stated Goal: to decrease pain PT Goal Formulation: With patient Time For Goal Achievement: 05/15/22 Potential to Achieve Goals: Good Progress towards PT goals: Progressing toward goals    Frequency    7X/week      PT Plan Current plan remains appropriate       AM-PAC PT "6 Clicks" Mobility   Outcome Measure  Help needed turning from your back to your side while in a flat bed without using bedrails?: A Little Help needed moving from lying on your back to sitting on the side of a flat bed without using bedrails?: A Little Help needed moving to and from a bed to a chair (including a wheelchair)?: A Little Help needed standing up from a chair using your arms (e.g., wheelchair or bedside chair)?: A Little Help needed to walk in hospital room?: A Little Help needed climbing 3-5 steps with a railing? : A Lot 6 Click Score: 17    End of Session Equipment Utilized During Treatment: Gait belt;Right knee immobilizer Activity Tolerance: Patient limited by fatigue;Patient limited by pain Patient left: in bed;with call bell/phone within reach;with family/visitor present Nurse Communication: Mobility status;Patient requests pain meds PT Visit Diagnosis: Other abnormalities of gait and mobility (R26.89);Pain Pain - Right/Left: Right Pain - part of body: Knee     Time: 3646-8032 PT Time Calculation (min) (ACUTE ONLY): 49 min  Charges:  $Gait Training: 8-22 mins $Therapeutic Exercise: 23-37 mins                     Thackston Carbo, PT, DPT   Acute Rehabilitation Department Pager #: [phone]   Sandra Cockayne 05/02/2022,  5:17 PM

## 2022-05-02 NOTE — Progress Notes (Signed)
Subjective: 1 Day Post-Op Procedure(s) (LRB): RIGHT TOTAL KNEE ARTHROPLASTY (Right) Patient reports pain as moderate.    Objective: Vital signs in last 24 hours: Temp:  [97.2 F (36.2 C)-98.3 F (36.8 C)] 98.3 F (36.8 C) (06/01 1950) Pulse Rate:  [50-75] 73 (06/01 1950) Resp:  [10-112] 18 (06/01 1950) BP: (103-163)/(55-99) 137/77 (06/01 1950) SpO2:  [89 %-99 %] 95 % (06/01 1950)  Intake/Output from previous day: 06/01 0701 - 06/02 0700 In: 1740 [P.O.:240; I.V.:1500] Out: 850 [Urine:800; Blood:50] Intake/Output this shift: Total I/O In: -  Out: 600 [Urine:600]  No results for input(s): HGB in the last 72 hours. No results for input(s): WBC, RBC, HCT, PLT in the last 72 hours. Recent Labs    05/02/22 0213  NA 135  K 4.2  CL 104  CO2 24  BUN 12  CREATININE 0.74  GLUCOSE 132*  CALCIUM 8.8*   No results for input(s): LABPT, INR in the last 72 hours.  Sensation intact distally Intact pulses distally Dorsiflexion/Plantar flexion intact Incision: dressing C/D/I No cellulitis present Compartment soft   Assessment/Plan: 1 Day Post-Op Procedure(s) (LRB): RIGHT TOTAL KNEE ARTHROPLASTY (Right) Up with therapy Discharge home with home health      Mcarthur Rossetti 05/02/2022, 6:42 AM

## 2022-05-02 NOTE — Progress Notes (Signed)
Patient ID: Angela Campos, female   DOB: July 01, 1971, 51 y.o.   MRN: [id number] The patient really does need to stay today.  She has had significant nausea postoperative and postoperative pain.  She is also not eating well.  Her vital signs are stable.  She is very motivated and her family has been very helpful.  Hopefully she can be discharged home tomorrow.

## 2022-05-02 NOTE — Progress Notes (Signed)
Physical Therapy Treatment Patient Details Name: Angela Campos MRN: [id number] DOB: [birth year] Today's Date: 05/02/2022   History of Present Illness Pt is a 51 y/o female s/p R TKA. No pertinent PMH.    PT Comments    The pt was able to make steady progress with stair training and hallway ambulation at this time, she remains limited by pain and fatigue, but was able to stand from EOB or recliner with minG and increased time. The pt completed 2 steps with modA of 2 and max directional cues, then completed 45 ft ambulation with use of RW and chair follow for safety. The pt is limited to knee ROM of 26-58 deg. Will continue to benefit from skilled PT to progress functional strength, ROM, and decrease dependence on caregiver assist at home.   Pt also found with pillow under knee for comfort at start of session, reinforced education regarding never placing pillow or other items under her knee to facilitate recovery of knee extension. Pt verbalized understanding.    Recommendations for follow up therapy are one component of a multi-disciplinary discharge planning process, led by the attending physician.  Recommendations may be updated based on patient status, additional functional criteria and insurance authorization.  Follow Up Recommendations  Follow physician's recommendations for discharge plan and follow up therapies     Assistance Recommended at Discharge Intermittent Supervision/Assistance  Patient can return home with the following Help with stairs or ramp for entrance;Assist for transportation;Assistance with cooking/housework;A little help with walking and/or transfers;A little help with bathing/dressing/bathroom   Equipment Recommendations  Rolling walker (2 wheels)    Recommendations for Other Services       Precautions / Restrictions Precautions Precautions: Knee Precaution Booklet Issued: No Precaution Comments: Verbally reviewed knee precautions. Restrictions Weight Bearing  Restrictions: Yes RLE Weight Bearing: Weight bearing as tolerated     Mobility  Bed Mobility Overal bed mobility: Needs Assistance Bed Mobility: Sit to Supine       Sit to supine: Min assist   General bed mobility comments: Assist for RLE management throughout    Transfers Overall transfer level: Needs assistance Equipment used: Rolling walker (2 wheels) Transfers: Sit to/from Stand Sit to Stand: Min guard           General transfer comment: significant increased time to rise and lower, cues for RLE extension.    Ambulation/Gait Ambulation/Gait assistance: Min guard Gait Distance (Feet): 45 Feet Assistive device: Rolling walker (2 wheels) Gait Pattern/deviations: Step-to pattern, Decreased stance time - right, Decreased weight shift to right, Shuffle Gait velocity: decreased Gait velocity interpretation: <1.31 ft/sec, indicative of household ambulator   General Gait Details: slowed and dependent on BUE support on RW to wt bear on RLE. minimal wt shift to R, improved ability to place R heel on ground with stance   Stairs Stairs: Yes Stairs assistance: Mod assist, +2 physical assistance, +2 safety/equipment Stair Management: No rails, Step to pattern, Backwards, With walker Number of Stairs: 2 General stair comments: pt and daughter instructed in stairs. needing modA with support from [facility] [place] to manage. poor wt acceptance on RLE     Balance Overall balance assessment: Needs assistance Sitting-balance support: No upper extremity supported, Feet supported Sitting balance-Leahy Scale: Fair     Standing balance support: Bilateral upper extremity supported Standing balance-Leahy Scale: Poor Standing balance comment: Reliant on BUE support  Cognition Arousal/Alertness: Awake/alert Behavior During Therapy: WFL for tasks assessed/performed Overall Cognitive Status: Within Functional Limits for tasks assessed                                  General Comments: pt self-limiting due to pain at times, benefits from encouragement and increased time. verbalized understanding        Exercises Total Joint Exercises Quad Sets: AROM, Right, 5 reps, Seated Heel Slides: AAROM, Right, 10 reps, Seated Knee Flexion: AAROM, Right, 10 reps, Seated Goniometric ROM: 26 deg - 58 deg    General Comments General comments (skin integrity, edema, etc.): VSS on RA. strongly reinforced no pillow under knee      Pertinent Vitals/Pain Pain Assessment Pain Assessment: No/denies pain Faces Pain Scale: Hurts whole lot Pain Location: R knee Pain Descriptors / Indicators: Grimacing, Guarding Pain Intervention(s): Monitored during session, Premedicated before session, Repositioned, Limited activity within patient's tolerance, Ice applied     PT Goals (current goals can now be found in the care plan section) Acute Rehab PT Goals Patient Stated Goal: to decrease pain PT Goal Formulation: With patient Time For Goal Achievement: 05/15/22 Potential to Achieve Goals: Good Progress towards PT goals: Progressing toward goals    Frequency    7X/week      PT Plan Current plan remains appropriate       AM-PAC PT "6 Clicks" Mobility   Outcome Measure  Help needed turning from your back to your side while in a flat bed without using bedrails?: A Little Help needed moving from lying on your back to sitting on the side of a flat bed without using bedrails?: A Little Help needed moving to and from a bed to a chair (including a wheelchair)?: A Little Help needed standing up from a chair using your arms (e.g., wheelchair or bedside chair)?: A Little Help needed to walk in hospital room?: A Little Help needed climbing 3-5 steps with a railing? : A Lot 6 Click Score: 17    End of Session Equipment Utilized During Treatment: Gait belt;Right knee immobilizer Activity Tolerance: Patient limited by fatigue;Patient limited  by pain (nausea) Patient left: in bed;with call bell/phone within reach;with family/visitor present Nurse Communication: Mobility status;Patient requests pain meds PT Visit Diagnosis: Other abnormalities of gait and mobility (R26.89);Pain Pain - Right/Left: Right Pain - part of body: Knee     Time: 7425-9563 PT Time Calculation (min) (ACUTE ONLY): 52 min  Charges:  $Gait Training: 23-37 mins $Therapeutic Exercise: 23-37 mins                     Artman Carbo, PT, DPT   Acute Rehabilitation Department Pager #: [phone]   Sandra Cockayne 05/02/2022, 1:14 PM

## 2022-05-02 NOTE — Discharge Instructions (Signed)

## 2022-05-02 NOTE — Progress Notes (Signed)
PT Cancellation Note  Patient Details Name: Angela Campos MRN: [id number] DOB: 10/16/1971   Cancelled Treatment:    Reason Eval/Treat Not Completed: Pain limiting ability to participate;Fatigue/lethargy limiting ability to participate this morning. Pt reports she has mobilized to the bathroom this morning and is now in too much pain and too fatigued to attempt PT session at this time. Will re-attempt after RN delivers pain medication.   Passero Carbo, PT, DPT   Acute Rehabilitation Department Pager #: [phone]   Sandra Cockayne 05/02/2022, 8:53 AM

## 2022-05-03 DIAGNOSIS — M1711 Unilateral primary osteoarthritis, right knee: Secondary | ICD-10-CM | POA: Diagnosis not present

## 2022-05-03 NOTE — Progress Notes (Signed)
No events. Stable for d/c home today with HHPT.

## 2022-05-03 NOTE — Progress Notes (Signed)
Physical Therapy Treatment Patient Details Name: Angela Campos: [id number] DOB: [birth year] Today's Date: 05/03/2022   History of Present Illness Pt is a 51 y/o female s/p R TKA. No pertinent PMH.    PT Comments    Pt received supine and agreeable to session, noted good placement of pillow under ankle on entry. Pt with steady progress this session however continues to be limited by pain and decreased ROM. Pt needing min assist for bed mobility to lower foot to ground secondary to pain. Pt min guard for tranfers and ambulation this session with no LOB and increased distance with focus on heel-toe foot strike as pt with tendency for toe-heel strike due to decreased extension of knee. Pt with fair tolerance for LE therex for increased ROM and strength with pt requiring assist for most exercise secondary to poor quad activation and swelling. Education reinforced re; HEP and compliance, no pillow under knee, benefits of continued mobility and activity recommendations, pt and spouse verbalizing understanding. Pt continues to benefit from skilled PT services to progress toward functional mobility goals.    Recommendations for follow up therapy are one component of a multi-disciplinary discharge planning process, led by the attending physician.  Recommendations may be updated based on patient status, additional functional criteria and insurance authorization.  Follow Up Recommendations  Follow physician's recommendations for discharge plan and follow up therapies     Assistance Recommended at Discharge Intermittent Supervision/Assistance  Patient can return home with the following Help with stairs or ramp for entrance;Assist for transportation;Assistance with cooking/housework;A little help with walking and/or transfers;A little help with bathing/dressing/bathroom   Equipment Recommendations  Rolling walker (2 wheels)    Recommendations for Other Services       Precautions / Restrictions  Precautions Precautions: Knee Precaution Booklet Issued: Yes (comment) Precaution Comments: Verbally reviewed knee precautions. Restrictions Weight Bearing Restrictions: Yes RLE Weight Bearing: Weight bearing as tolerated     Mobility  Bed Mobility Overal bed mobility: Needs Assistance Bed Mobility: Supine to Sit     Supine to sit: Min assist     General bed mobility comments: Assist for RLE management throughout    Transfers Overall transfer level: Needs assistance Equipment used: Rolling walker (2 wheels) Transfers: Sit to/from Stand Sit to Stand: Min guard           General transfer comment: slightly increased time to rise and lower, cues for RLE extension.    Ambulation/Gait Ambulation/Gait assistance: Min guard Gait Distance (Feet): 80 Feet Assistive device: Rolling walker (2 wheels) Gait Pattern/deviations: Step-to pattern, Decreased stance time - right, Decreased weight shift to right, Shuffle, Step-through pattern Gait velocity: decreased     General Gait Details: slowed and dependent on BUE support, cues for heel toe foor strike as pt with tendency for toe heel due to lack of knee extension   Stairs Stairs: Yes       General stair comments: verbally review sequencing forwards and backwards with pt and spouse, as pt declinging repeated practice this session, stating confidence upon d/c to home   Wheelchair Mobility    Modified Rankin (Stroke Patients Only)       Balance Overall balance assessment: Needs assistance Sitting-balance support: No upper extremity supported, Feet supported Sitting balance-Leahy Scale: Fair     Standing balance support: Bilateral upper extremity supported Standing balance-Leahy Scale: Poor Standing balance comment: Reliant on BUE support  Cognition Arousal/Alertness: Awake/alert Behavior During Therapy: WFL for tasks assessed/performed Overall Cognitive Status: Within  Functional Limits for tasks assessed                                 General Comments: benefits from encouragement and increased time.        Exercises Total Joint Exercises Ankle Circles/Pumps: AROM, Both, 10 reps Quad Sets: AROM, Right, 10 reps, Supine Heel Slides: AAROM, Right, 10 reps, Supine Hip ABduction/ADduction: AAROM, Right, 5 reps, Supine Knee Flexion: AAROM, Right, 10 reps, Seated    General Comments General comments (skin integrity, edema, etc.): VSS on RA      Pertinent Vitals/Pain Pain Assessment Pain Assessment: Faces Faces Pain Scale: Hurts even more Pain Location: R knee Pain Descriptors / Indicators: Grimacing, Guarding Pain Intervention(s): Limited activity within patient's tolerance, Monitored during session, Premedicated before session, Repositioned    Home Living                          Prior Function            PT Goals (current goals can now be found in the care plan section) Acute Rehab PT Goals Patient Stated Goal: to decrease pain PT Goal Formulation: With patient Time For Goal Achievement: 05/15/22    Frequency    7X/week      PT Plan Current plan remains appropriate    Co-evaluation              AM-PAC PT "6 Clicks" Mobility   Outcome Measure  Help needed turning from your back to your side while in a flat bed without using bedrails?: A Little Help needed moving from lying on your back to sitting on the side of a flat bed without using bedrails?: A Little Help needed moving to and from a bed to a chair (including a wheelchair)?: A Little Help needed standing up from a chair using your arms (e.g., wheelchair or bedside chair)?: A Little Help needed to walk in hospital room?: A Little Help needed climbing 3-5 steps with a railing? : A Lot 6 Click Score: 17    End of Session Equipment Utilized During Treatment: Gait belt Activity Tolerance: Patient limited by fatigue;Patient limited by  pain Patient left: with call bell/phone within reach;with family/visitor present;in chair Nurse Communication: Mobility status PT Visit Diagnosis: Other abnormalities of gait and mobility (R26.89);Pain Pain - Right/Left: Right Pain - part of body: Knee     Time: 0827-0858 PT Time Calculation (min) (ACUTE ONLY): 31 min  Charges:  $Gait Training: 8-22 mins $Therapeutic Exercise: 8-22 mins                     Katura Eatherly R. PTA Acute Rehabilitation Services Office: [place] 05/03/2022, 9:07 AM

## 2022-05-03 NOTE — TOC Transition Note (Signed)
Transition of Care [facility]) - CM/SW Discharge Note   Patient Details  Name: Angela Campos MRN: [id number] Date of Birth: [birth year]  [facility] CM/SW Contact:  Bartholomew Crews, RN Phone Number: [phone] 05/03/2022, 10:37 AM   Clinical Narrative:     Patient to transition home today. Verified DME RW and 3n1 delivered to the room yesterday. [facility] to f/u with [facility] PT as arranged outpatient. No further TOC needs identified.   Final next level of care: [place] Barriers to Discharge: No Barriers Identified   Patient Goals and CMS Choice        Discharge Placement                       Discharge Plan and Services                    Date DME Agency Contacted: 05/03/22 Time DME Agency Contacted: [id number] Representative spoke with at DME Agency: [place]: PT [place]: [place] Date [place]: 05/03/22 Time [place]: [place] Representative spoke with at [place]: [place] Determinants of Health ([place]) Interventions     Readmission Risk Interventions     View : No data to display.

## 2022-05-03 NOTE — Plan of Care (Signed)

## 2022-05-04 NOTE — Discharge Summary (Signed)
Patient ID: Angela Campos MRN: [id number] DOB/AGE: 08/20/1971 51 y.o.  Admit date: 05/01/2022 Discharge date: 05/03/2022  Admission Diagnoses:  Principal Problem:   Unilateral primary osteoarthritis, right knee Active Problems:   OA (osteoarthritis) of knee   Status post total right knee replacement   Discharge Diagnoses:  Same  History reviewed. No pertinent past medical history.  Surgeries: Procedure(s): RIGHT TOTAL KNEE ARTHROPLASTY on 05/01/2022   Consultants:   Discharged Condition: Improved  Hospital Course: HATSUMI STEINHART is an 51 y.o. female who was admitted 05/01/2022 for operative treatment ofUnilateral primary osteoarthritis, right knee. Patient has severe unremitting pain that affects sleep, daily activities, and work/hobbies. After pre-op clearance the patient was taken to the operating room on 05/01/2022 and underwent  Procedure(s): RIGHT TOTAL KNEE ARTHROPLASTY.    Patient was given perioperative antibiotics:  Anti-infectives (From admission, onward)    Start     Dose/Rate Route Frequency Ordered Stop   05/01/22 1330  ceFAZolin (ANCEF) IVPB 1 g/50 mL premix        1 g 100 mL/hr over 30 Minutes Intravenous Every 6 hours 05/01/22 1139 05/02/22 1133   05/01/22 0600  ceFAZolin (ANCEF) IVPB 2g/100 mL premix        2 g 200 mL/hr over 30 Minutes Intravenous On call to O.R. 05/01/22 0555 05/01/22 0730        Patient was given sequential compression devices, early ambulation, and chemoprophylaxis to prevent DVT.  Patient benefited maximally from hospital stay and there were no complications.    Recent vital signs: No data found.   Recent laboratory studies:  Recent Labs    05/02/22 0213  NA 135  K 4.2  CL 104  CO2 24  BUN 12  CREATININE 0.74  GLUCOSE 132*  CALCIUM 8.8*     Discharge Medications:   Allergies as of 05/03/2022       Reactions   Iodine Anaphylaxis   Methylprednisolone Hives, Shortness Of Breath, Swelling        Medication List     TAKE  these medications    aspirin 81 MG chewable tablet Chew 1 tablet (81 mg total) by mouth 2 (two) times daily.   EPINEPHrine 0.3 mg/0.3 mL Soaj injection Commonly known as: EPI-PEN Inject 0.3 mg into the muscle as needed for anaphylaxis.   gabapentin 100 MG capsule Commonly known as: NEURONTIN Take 1 capsule (100 mg total) by mouth 3 (three) times daily.   HYDROmorphone 4 MG tablet Commonly known as: Dilaudid Take 1 tablet (4 mg total) by mouth every 4 (four) hours as needed for severe pain.   ibuprofen 200 MG tablet Commonly known as: ADVIL Take 400 mg by mouth every 6 (six) hours as needed for mild pain.   ondansetron 4 MG tablet Commonly known as: ZOFRAN Take 1 tablet (4 mg total) by mouth every 8 (eight) hours as needed for nausea or vomiting.   tiZANidine 4 MG tablet Commonly known as: ZANAFLEX Take 1 tablet (4 mg total) by mouth every 6 (six) hours as needed for muscle spasms.        Diagnostic Studies: DG Knee Right Port  Result Date: 05/01/2022 CLINICAL DATA:  Status post total right knee arthroplasty. EXAM: PORTABLE RIGHT KNEE - 1-2 VIEW COMPARISON:  Frontal view of the right knee 11/19/2021 FINDINGS: Interval total right knee arthroplasty. No perihardware lucency is seen to indicate hardware failure or loosening. Expected postoperative changes including anterior soft tissue swelling and subcutaneous air. Anterior surgical skin staples. No acute  fracture or dislocation. IMPRESSION: Status post total right knee arthroplasty without evidence of hardware failure. Electronically Signed   By: Yvonne Kendall M.D.   On: 05/01/2022 10:15    Disposition: Discharge disposition: 01-Home or Self Care       Discharge Instructions     Call MD / Call 911   Complete by: As directed    If you experience chest pain or shortness of breath, CALL 911 and be transported to the hospital emergency room.  If you develope a fever above 101.5 F, pus (white drainage) or increased drainage or  redness at the wound, or calf pain, call your surgeon's office.   Constipation Prevention   Complete by: As directed    Drink plenty of fluids.  Prune juice may be helpful.  You may use a stool softener, such as Colace (over the counter) 100 mg twice a day.  Use MiraLax (over the counter) for constipation as needed.   Driving restrictions   Complete by: As directed    No driving while taking narcotic pain meds.   Increase activity slowly as tolerated   Complete by: As directed    Post-operative opioid taper instructions:   Complete by: As directed    POST-OPERATIVE OPIOID TAPER INSTRUCTIONS: It is important to wean off of your opioid medication as soon as possible. If you do not need pain medication after your surgery it is ok to stop day one. Opioids include: Codeine, Hydrocodone(Norco, Vicodin), Oxycodone(Percocet, oxycontin) and hydromorphone amongst others.  Long term and even short term use of opiods can cause: Increased pain response Dependence Constipation Depression Respiratory depression And more.  Withdrawal symptoms can include Flu like symptoms Nausea, vomiting And more Techniques to manage these symptoms Hydrate well Eat regular healthy meals Stay active Use relaxation techniques(deep breathing, meditating, yoga) Do Not substitute Alcohol to help with tapering If you have been on opioids for less than two weeks and do not have pain than it is ok to stop all together.  Plan to wean off of opioids This plan should start within one week post op of your joint replacement. Maintain the same interval or time between taking each dose and first decrease the dose.  Cut the total daily intake of opioids by one tablet each day Next start to increase the time between doses. The last dose that should be eliminated is the evening dose.           Follow-up Information     Mcarthur Rossetti, MD Follow up in 2 week(s).   Specialty: Orthopedic Surgery Contact  information: [place] [address] [place], [place] Follow up.   Specialty: [place] Why: home health PT services will be provided by [facility] information: [place] [address] [phone]                  Signed: Mcarthur Rossetti 05/04/2022, 6:55 PM

## 2022-05-05 ENCOUNTER — Encounter (HOSPITAL_COMMUNITY): Payer: Self-pay | Admitting: Orthopaedic Surgery

## 2022-05-10 ENCOUNTER — Other Ambulatory Visit: Payer: Self-pay | Admitting: Orthopaedic Surgery

## 2022-05-12 ENCOUNTER — Other Ambulatory Visit: Payer: Self-pay | Admitting: Orthopaedic Surgery

## 2022-05-12 ENCOUNTER — Telehealth: Payer: Self-pay | Admitting: Orthopaedic Surgery

## 2022-05-12 NOTE — Telephone Encounter (Signed)
LMOM for patient with the message below from [facility]

## 2022-05-12 NOTE — Telephone Encounter (Signed)
Terri from [facility] called. She is the PT working with patient in home. Patient is requesting something else for pain. Says that pain medication is not helping.  Terri's call back number is [phone]

## 2022-05-14 ENCOUNTER — Encounter: Payer: Self-pay | Admitting: Orthopaedic Surgery

## 2022-05-14 ENCOUNTER — Ambulatory Visit (INDEPENDENT_AMBULATORY_CARE_PROVIDER_SITE_OTHER): Payer: BC Managed Care – PPO | Admitting: Orthopaedic Surgery

## 2022-05-14 ENCOUNTER — Other Ambulatory Visit: Payer: Self-pay

## 2022-05-14 DIAGNOSIS — Z96651 Presence of right artificial knee joint: Secondary | ICD-10-CM

## 2022-05-14 MED ORDER — OXYCODONE HCL 10 MG PO TABS: 10.0000 mg | ORAL_TABLET | Freq: Four times a day (QID) | ORAL | 0 refills | Status: DC | PRN

## 2022-05-14 NOTE — Progress Notes (Signed)
The patient is a very pleasant 51 year old female who is 2 weeks status post a press-fit right total knee arthroplasty secondary to significant arthritis of the right knee.  She tried about all forms of conservative treatment for a long period of time.  Given her young age she is certainly having appropriate pain as it relates to the replacement surgery.  On exam her calf is soft.  There are some slight swelling in her feet and ankles.  Her incision is well-healed and the staples were removed and Steri-Strips applied.  She lacks full extension by about 5 degrees and I can only flex her to about 70 degrees.  She understands the importance of mobility and range of motion of that knee.  We will work on transitioning her to outpatient physical therapy.  I will send in more pain medications for her which is appropriate for her to take.  All questions and concerns were answered and addressed.  I will see her back in 4 weeks for repeat exam.

## 2022-05-16 NOTE — Therapy (Signed)
OUTPATIENT PHYSICAL THERAPY LOWER EXTREMITY EVALUATION   Patient Name: Angela Campos MRN: [id number] DOB:1971-03-22, 51 y.o., female Today's Date: 05/20/2022   PT End of Session - 05/20/22 1014     Visit Number 1    Number of Visits 22    Authorization - Number of Visits 22    PT Start Time 0850    PT Stop Time 0930    PT Time Calculation (min) 40 min    Activity Tolerance Patient tolerated treatment well    Behavior During Therapy [facility] for tasks assessed/performed             History reviewed. No pertinent past medical history. Past Surgical History:  Procedure Laterality Date   CESAREAN SECTION     1999 and 2001   DILATION AND CURETTAGE OF UTERUS  2001   KNEE CARTILAGE SURGERY Right    due to meniscus tears   TOOTH EXTRACTION     TOTAL KNEE ARTHROPLASTY Right 05/01/2022   Procedure: RIGHT TOTAL KNEE ARTHROPLASTY;  Surgeon: Mcarthur Rossetti, MD;  Location: [place];  Service: Orthopedics;  Laterality: Right;   Patient Active Problem List   Diagnosis Date Noted   OA (osteoarthritis) of knee 05/01/2022   Status post total right knee replacement 05/01/2022   Status post arthroscopy of right knee 03/04/2018   Unilateral primary osteoarthritis, right knee 03/04/2018   Acute pain of right knee 01/27/2018    PCP: None  REFERRING PROVIDER: Mcarthur Rossetti, MD  REFERRING DIAG: [phone] (ICD-10-CM) - Status post total right knee replacement   THERAPY DIAG:  Difficulty in walking, not elsewhere classified  Muscle weakness (generalized)  Localized edema  Stiffness of right knee, not elsewhere classified  Right knee pain, unspecified chronicity  Rationale for Evaluation and Treatment Rehabilitation  ONSET DATE: Surgery 05/01/2022  SUBJECTIVE:   SUBJECTIVE STATEMENT: Angela Campos is waking up about 4 times a night, but she can back to sleep.  She taking advil and an oxy at night before bed.  She is happy with her progress thus far but knows that stiffness needs to  improve.  PERTINENT HISTORY: Previous R knee surgery (meniscus)  PAIN:  Are you having pain? Yes: NPRS scale: 3-7/10 Pain location: R knee Pain description: Achy, can be sharp Aggravating factors: Too much WB or prolonged positions Relieving factors: Ice and pain medications  PRECAUTIONS: None  WEIGHT BEARING RESTRICTIONS No  FALLS:  Has patient fallen in last 6 months? No  LIVING ENVIRONMENT: Lives with: lives with their family Lives in: House/apartment Stairs: Yes: Internal: 15-25 steps; on left going up Has following equipment at home: [name] - 2 wheeled  OCCUPATION: Engineer, maintenance (IT) full-time  PLOF: Independent  PATIENT GOALS Return to normal function.  Walking and work.   OBJECTIVE:   DIAGNOSTIC FINDINGS: IMPRESSION: Status post total right knee arthroplasty without evidence of hardware failure.  PATIENT SURVEYS:  FOTO 36 (Goal 64 in 16 visits)  COGNITION:  Overall cognitive status: Within functional limits for tasks assessed     SENSATION: No complaints of peripheral pains or paresthesias  EDEMA:  Noted but not measured  LOWER EXTREMITY ROM:  Active ROM Right eval Left eval  Hip flexion    Hip extension    Hip abduction    Hip adduction    Hip internal rotation    Hip external rotation    Knee flexion 53 133  Knee extension 13 1  Ankle dorsiflexion    Ankle plantarflexion    Ankle inversion  Ankle eversion     (Blank rows = not tested)  LOWER EXTREMITY MMT:  MMT Right eval Left eval  Hip flexion    Hip extension    Hip abduction    Hip adduction    Hip internal rotation    Hip external rotation    Knee flexion    Knee extension 3-/5 5/5  Ankle dorsiflexion    Ankle plantarflexion    Ankle inversion    Ankle eversion     (Blank rows = not tested)  GAIT: Distance walked: 100+ feet Assistive device utilized: Environmental consultant - 2 wheeled Level of assistance: Modified independence Comments: Step-to gait    TODAY'S  TREATMENT: 05/20/2022 Quadriceps sets with heel prop 2 sets of 10 for 5 seconds Tailgate knee flexion 1 minute Knee flexion AAROM (L pushes R into flexion) 10X 10 seconds Seated knee extension stretch (hip in neutral-toes up) 3 minutes   PATIENT EDUCATION:  Education details: HEP, see above Person educated: Patient and Child(ren) Education method: Explanation, Demonstration, Tactile cues, Verbal cues, and Handouts Education comprehension: verbalized understanding, returned demonstration, verbal cues required, tactile cues required, and needs further education   HOME EXERCISE PROGRAM: Access Code: [email or web address] URL: [email or web address] Date: 05/20/2022 Prepared by: Vista Mink  Exercises - Supine Quadricep Sets  - 5 x daily - 7 x weekly - 2 sets - 10 reps - 5 second hold - Seated Knee Flexion AAROM  - 3-5 x daily - 7 x weekly - 1 sets - 1 reps - 3 minutes hold - Seated Knee Extension Stretch with Chair  - 3-5 x daily - 7 x weekly - 1 sets - 1 reps - 3-5 minutes hold  ASSESSMENT:  CLINICAL IMPRESSION: Patient is a 51 y.o. female who was seen today for physical therapy evaluation and treatment for s/p R knee replacement.    OBJECTIVE IMPAIRMENTS Abnormal gait, decreased activity tolerance, decreased endurance, decreased knowledge of condition, decreased mobility, difficulty walking, decreased ROM, decreased strength, increased edema, impaired perceived functional ability, and pain.   ACTIVITY LIMITATIONS bending, standing, squatting, sleeping, stairs, bed mobility, and locomotion level  PARTICIPATION LIMITATIONS: driving, community activity, and occupation  PERSONAL FACTORS  No other factors  are also affecting patient's functional outcome.   REHAB POTENTIAL: Good  CLINICAL DECISION MAKING: Stable/uncomplicated  EVALUATION COMPLEXITY: Low   GOALS: Goals reviewed with patient? Yes  SHORT TERM GOALS: Target date: 06/17/2022  Improve R knee AROM to 5 to 90  degrees Baseline: 13 to 53 degrees Goal status: INITIAL  2.  Angela Campos will transition to a cane for walking Baseline: Wheeled walker Goal status: INITIAL  LONG TERM GOALS: Target date: 07/15/2022   Improve FOTO to 64 Baseline: 36 Goal status: INITIAL  2.  Improve R knee pain to 0-3/10 on the Numeric Pain Rating Scale Baseline: 3-7/10 Goal status: INITIAL  3.  Improve R quadriceps strength as assessed by FOTO, walking without an AD and objective measures Baseline:  36 FOTO, walker and 3-/5 MMT Goal status: INITIAL  4.  Improve R knee AROM to 3 to 110 degrees or better Baseline: 13 to 53 degrees Goal status: INITIAL  5.  Astella will be independent with her HEP at DC Baseline: Started 05/20/2022 Goal status: INITIAL   PLAN: PT FREQUENCY: 3x/week  PT DURATION: 8 weeks  PLANNED INTERVENTIONS: Therapeutic exercises, Therapeutic activity, Neuromuscular re-education, Balance training, Gait training, Patient/Family education, Joint mobilization, Stair training, Electrical stimulation, Cryotherapy, Vasopneumatic device, and Manual therapy  PLAN FOR NEXT SESSION: Review HEP, vaso,  focus on AROM (particularly extension), quadriceps strength and edema control.  Bike for [employer].   Farley Ly, PT, MPT 05/20/2022, 11:53 AM

## 2022-05-20 ENCOUNTER — Ambulatory Visit (INDEPENDENT_AMBULATORY_CARE_PROVIDER_SITE_OTHER): Payer: BC Managed Care – PPO | Admitting: Rehabilitative and Restorative Service Providers"

## 2022-05-20 ENCOUNTER — Encounter: Payer: Self-pay | Admitting: Rehabilitative and Restorative Service Providers"

## 2022-05-20 ENCOUNTER — Other Ambulatory Visit: Payer: Self-pay | Admitting: Orthopaedic Surgery

## 2022-05-20 DIAGNOSIS — M6281 Muscle weakness (generalized): Secondary | ICD-10-CM

## 2022-05-20 DIAGNOSIS — M25661 Stiffness of right knee, not elsewhere classified: Secondary | ICD-10-CM | POA: Diagnosis not present

## 2022-05-20 DIAGNOSIS — R262 Difficulty in walking, not elsewhere classified: Secondary | ICD-10-CM

## 2022-05-20 DIAGNOSIS — M25561 Pain in right knee: Secondary | ICD-10-CM

## 2022-05-20 DIAGNOSIS — R600 Localized edema: Secondary | ICD-10-CM | POA: Diagnosis not present

## 2022-05-21 ENCOUNTER — Encounter: Payer: Self-pay | Admitting: Physical Therapy

## 2022-05-21 ENCOUNTER — Ambulatory Visit (INDEPENDENT_AMBULATORY_CARE_PROVIDER_SITE_OTHER): Payer: BC Managed Care – PPO | Admitting: Physical Therapy

## 2022-05-21 DIAGNOSIS — M6281 Muscle weakness (generalized): Secondary | ICD-10-CM | POA: Diagnosis not present

## 2022-05-21 DIAGNOSIS — R262 Difficulty in walking, not elsewhere classified: Secondary | ICD-10-CM | POA: Diagnosis not present

## 2022-05-21 DIAGNOSIS — R600 Localized edema: Secondary | ICD-10-CM | POA: Diagnosis not present

## 2022-05-21 DIAGNOSIS — M25661 Stiffness of right knee, not elsewhere classified: Secondary | ICD-10-CM

## 2022-05-21 DIAGNOSIS — M25561 Pain in right knee: Secondary | ICD-10-CM

## 2022-05-21 NOTE — Therapy (Signed)
OUTPATIENT PHYSICAL THERAPY TREATMENT NOTE   Patient Name: Angela Campos MRN: [id number] DOB:10/20/1971, 51 y.o., female Today's Date: 05/21/2022   END OF SESSION:   PT End of Session - 05/21/22 0815     Visit Number 2    Number of Visits 22    Authorization - Number of Visits 22    PT Start Time 0800    PT Stop Time 0848    PT Time Calculation (min) 48 min    Activity Tolerance Patient tolerated treatment well    Behavior During Therapy Angela Campos for tasks assessed/performed             History reviewed. No pertinent past medical history. Past Surgical History:  Procedure Laterality Date   CESAREAN SECTION     1999 and 2001   DILATION AND CURETTAGE OF UTERUS  2001   KNEE CARTILAGE SURGERY Right    due to meniscus tears   TOOTH EXTRACTION     TOTAL KNEE ARTHROPLASTY Right 05/01/2022   Procedure: RIGHT TOTAL KNEE ARTHROPLASTY;  Surgeon: Mcarthur Rossetti, MD;  Location: [place];  Service: Orthopedics;  Laterality: Right;   Patient Active Problem List   Diagnosis Date Noted   OA (osteoarthritis) of knee 05/01/2022   Status post total right knee replacement 05/01/2022   Status post arthroscopy of right knee 03/04/2018   Unilateral primary osteoarthritis, right knee 03/04/2018   Acute pain of right knee 01/27/2018     THERAPY DIAG:  Difficulty in walking, not elsewhere classified  Muscle weakness (generalized)  Localized edema  Stiffness of right knee, not elsewhere classified  Right knee pain, unspecified chronicity  REFERRING PROVIDER: Mcarthur Rossetti, MD   REFERRING DIAG: [phone] (ICD-10-CM) - Status post total right knee replacement    Rationale for Evaluation and Treatment Rehabilitation   ONSET DATE: Surgery 05/01/2022   SUBJECTIVE:    SUBJECTIVE STATEMENT: Angela Campos says her knee is doing ok toay   PERTINENT HISTORY: Previous R knee surgery (meniscus)   PAIN:  Are you having pain? Yes: NPRS scale: 3/10 upon arrival Pain location: R  knee Pain description: Achy, can be sharp Aggravating factors: Too much WB or prolonged positions Relieving factors: Ice and pain medications   PRECAUTIONS: None   WEIGHT BEARING RESTRICTIONS No   FALLS:  Has patient fallen in last 6 months? No   LIVING ENVIRONMENT: Lives with: lives with their family Lives in: House/apartment Stairs: Yes: Internal: 15-25 steps; on left going up Has following equipment at home: [name] - 2 wheeled   OCCUPATION: Engineer, maintenance (IT) full-time   PLOF: Independent   PATIENT GOALS Return to normal function.  Walking and work.     OBJECTIVE:    DIAGNOSTIC FINDINGS: IMPRESSION: Status post total right knee arthroplasty without evidence of hardware failure.   PATIENT SURVEYS:  FOTO 36 (Goal 64 in 16 visits)   COGNITION:           Overall cognitive status: Within functional limits for tasks assessed                          SENSATION: No complaints of peripheral pains or paresthesias   EDEMA:  Noted but not measured   LOWER EXTREMITY ROM:   Active ROM Right eval Left eval  Hip flexion      Hip extension      Hip abduction      Hip adduction      Hip internal rotation  Hip external rotation      Knee flexion 53 133  Knee extension 13 1  Ankle dorsiflexion      Ankle plantarflexion      Ankle inversion      Ankle eversion       (Blank rows = not tested)   LOWER EXTREMITY MMT:   MMT Right eval Left eval  Hip flexion      Hip extension      Hip abduction      Hip adduction      Hip internal rotation      Hip external rotation      Knee flexion      Knee extension 3-/5 5/5  Ankle dorsiflexion      Ankle plantarflexion      Ankle inversion      Ankle eversion       (Blank rows = not tested)   GAIT: Distance walked: 100+ feet Assistive device utilized: Environmental consultant - 2 wheeled Level of assistance: Modified independence Comments: Step-to gait       TODAY'S TREATMENT: 05/21/22 -Nu step X 8 min L4 UE/LE -Slantboard stretch 30 sec  X 3 bilat for gastrocs -Standing heel and toe raises X 15 bilat with UE support -Standing hip abductions X 15 bilat with UE support -Standing hip marches X 15 bilat with UE support -Seated knee flexion stretch AAROM 10 sec X 10 -seated LAQ with contralateral knee flexion then repeat on opposide side X 2 min -Supine quad set 5 sec 2X10 -Supine TKE stretch first 3 minutes of vaso  -Manual therapy: Rt knee PROM with overpressure into flexion and extension to tolerance, manual hamstring   stretching and gastroc stretching 30 sec X 3 each  Modalities: Vasopneumatic device X 10 min, medium compression, 34 deg to reduce edema and inflammation   05/20/2022 Quadriceps sets with heel prop 2 sets of 10 for 5 seconds Tailgate knee flexion 1 minute Knee flexion AAROM (L pushes R into flexion) 10X 10 seconds Seated knee extension stretch (hip in neutral-toes up) 3 minutes     PATIENT EDUCATION:  Education details: HEP, see above Person educated: Patient and Child(ren) Education method: Explanation, Demonstration, Tactile cues, Verbal cues, and Handouts Education comprehension: verbalized understanding, returned demonstration, verbal cues required, tactile cues required, and needs further education     HOME EXERCISE PROGRAM: Access Code: [email or web address] URL: [email or web address] Date: 05/20/2022 Prepared by: Vista Mink   Exercises - Supine Quadricep Sets  - 5 x daily - 7 x weekly - 2 sets - 10 reps - 5 second hold - Seated Knee Flexion AAROM  - 3-5 x daily - 7 x weekly - 1 sets - 1 reps - 3 minutes hold - Seated Knee Extension Stretch with Chair  - 3-5 x daily - 7 x weekly - 1 sets - 1 reps - 3-5 minutes hold   ASSESSMENT:   CLINICAL IMPRESSION: Reviewed HEP and she shows good understanding, she had good overall tolerance to beginning program for knee strength and ROM focus.      OBJECTIVE IMPAIRMENTS Abnormal gait, decreased activity tolerance, decreased endurance, decreased  knowledge of condition, decreased mobility, difficulty walking, decreased ROM, decreased strength, increased edema, impaired perceived functional ability, and pain.    ACTIVITY LIMITATIONS bending, standing, squatting, sleeping, stairs, bed mobility, and locomotion level   PARTICIPATION LIMITATIONS: driving, community activity, and occupation   PERSONAL FACTORS  No other factors  are also affecting patient's functional outcome.    REHAB POTENTIAL: Good  CLINICAL DECISION MAKING: Stable/uncomplicated   EVALUATION COMPLEXITY: Low     GOALS: Goals reviewed with patient? Yes   SHORT TERM GOALS: Target date: 06/17/2022  Improve R knee AROM to 5 to 90 degrees Baseline: 13 to 53 degrees Goal status: INITIAL   2.  Angela Campos will transition to a cane for walking Baseline: Wheeled walker Goal status: INITIAL   LONG TERM GOALS: Target date: 07/15/2022    Improve FOTO to 64 Baseline: 36 Goal status: INITIAL   2.  Improve R knee pain to 0-3/10 on the Numeric Pain Rating Scale Baseline: 3-7/10 Goal status: INITIAL   3.  Improve R quadriceps strength as assessed by FOTO, walking without an AD and objective measures Baseline:  36 FOTO, walker and 3-/5 MMT Goal status: INITIAL   4.  Improve R knee AROM to 3 to 110 degrees or better Baseline: 13 to 53 degrees Goal status: INITIAL   5.  Angela Campos will be independent with her HEP at DC Baseline: Started 05/20/2022 Goal status: INITIAL     PLAN: PT FREQUENCY: 3x/week   PT DURATION: 8 weeks   PLANNED INTERVENTIONS: Therapeutic exercises, Therapeutic activity, Neuromuscular re-education, Balance training, Gait training, Patient/Family education, Joint mobilization, Stair training, Electrical stimulation, Cryotherapy, Vasopneumatic device, and Manual therapy   PLAN FOR NEXT SESSION: vaso if desired focus on AROM (particularly extension), quadriceps strength and edema control.  Bike for [employer].    Debbe Odea, PT,DPT 05/21/2022, 8:42  AM

## 2022-05-22 ENCOUNTER — Ambulatory Visit (INDEPENDENT_AMBULATORY_CARE_PROVIDER_SITE_OTHER): Payer: BC Managed Care – PPO | Admitting: Rehabilitative and Restorative Service Providers"

## 2022-05-22 ENCOUNTER — Encounter: Payer: Self-pay | Admitting: Rehabilitative and Restorative Service Providers"

## 2022-05-22 DIAGNOSIS — R262 Difficulty in walking, not elsewhere classified: Secondary | ICD-10-CM | POA: Diagnosis not present

## 2022-05-22 DIAGNOSIS — M25561 Pain in right knee: Secondary | ICD-10-CM

## 2022-05-22 DIAGNOSIS — M25661 Stiffness of right knee, not elsewhere classified: Secondary | ICD-10-CM

## 2022-05-22 DIAGNOSIS — M6281 Muscle weakness (generalized): Secondary | ICD-10-CM

## 2022-05-22 DIAGNOSIS — R600 Localized edema: Secondary | ICD-10-CM

## 2022-05-22 NOTE — Therapy (Signed)
OUTPATIENT PHYSICAL THERAPY TREATMENT NOTE   Patient Name: Angela Campos MRN: [id number] DOB:[birth year], 51 y.o., female Today's Date: 05/22/2022   END OF SESSION:   PT End of Session - 05/22/22 0807     Visit Number 3    Number of Visits 22    Authorization - Number of Visits 22    PT Start Time 0802    PT Stop Time 0859    PT Time Calculation (min) 57 min    Activity Tolerance Patient tolerated treatment well;Patient limited by pain    Behavior During Therapy [facility] for tasks assessed/performed              History reviewed. No pertinent past medical history. Past Surgical History:  Procedure Laterality Date   CESAREAN SECTION     1999 and 2001   DILATION AND CURETTAGE OF UTERUS  2001   KNEE CARTILAGE SURGERY Right    due to meniscus tears   TOOTH EXTRACTION     TOTAL KNEE ARTHROPLASTY Right 05/01/2022   Procedure: RIGHT TOTAL KNEE ARTHROPLASTY;  Surgeon: Mcarthur Rossetti, MD;  Location: [place];  Service: Orthopedics;  Laterality: Right;   Patient Active Problem List   Diagnosis Date Noted   OA (osteoarthritis) of knee 05/01/2022   Status post total right knee replacement 05/01/2022   Status post arthroscopy of right knee 03/04/2018   Unilateral primary osteoarthritis, right knee 03/04/2018   Acute pain of right knee 01/27/2018     THERAPY DIAG:  Difficulty in walking, not elsewhere classified  Muscle weakness (generalized)  Localized edema  Stiffness of right knee, not elsewhere classified  Right knee pain, unspecified chronicity  REFERRING PROVIDER: Mcarthur Rossetti, MD   REFERRING DIAG: [phone] (ICD-10-CM) - Status post total right knee replacement    Rationale for Evaluation and Treatment Rehabilitation   ONSET DATE: Surgery 05/01/2022   SUBJECTIVE:    SUBJECTIVE STATEMENT: Angela Campos is getting about 4 hours of uninterrupted sleep per night.  She reports good early HEP compliance, not perfect but good.   PERTINENT HISTORY: Previous R  knee surgery (meniscus)   PAIN:  Are you having pain? Yes: NPRS scale: 3-7/10 upon arrival Pain location: R knee Pain description: Achy, can be sharp Aggravating factors: Too much WB or prolonged positions Relieving factors: Ice and pain medications   PRECAUTIONS: None   WEIGHT BEARING RESTRICTIONS No   FALLS:  Has patient fallen in last 6 months? No   LIVING ENVIRONMENT: Lives with: lives with their family Lives in: House/apartment Stairs: Yes: Internal: 15-25 steps; on left going up Has following equipment at home: [name] - 2 wheeled   OCCUPATION: Engineer, maintenance (IT) full-time   PLOF: Independent   PATIENT GOALS: Return to normal function.  Walking and work.     OBJECTIVE:    DIAGNOSTIC FINDINGS: IMPRESSION: Status post total right knee arthroplasty without evidence of hardware failure.   PATIENT SURVEYS:  FOTO 36 (Goal 64 in 16 visits)   COGNITION:           Overall cognitive status: Within functional limits for tasks assessed                          SENSATION: No complaints of peripheral pains or paresthesias   EDEMA:  Noted but not measured   LOWER EXTREMITY ROM:   Active ROM Right 05/20/22 eval Left 05/20/22 eval Right 05/22/22  Hip flexion       Hip extension  Hip abduction       Hip adduction       Hip internal rotation       Hip external rotation       Knee flexion 53 133 65  Knee extension '13 1 8  '$ Ankle dorsiflexion       Ankle plantarflexion       Ankle inversion       Ankle eversion        (Blank rows = not tested)   LOWER EXTREMITY MMT:   MMT Right eval Left eval  Hip flexion      Hip extension      Hip abduction      Hip adduction      Hip internal rotation      Hip external rotation      Knee flexion      Knee extension 3-/5 5/5  Ankle dorsiflexion      Ankle plantarflexion      Ankle inversion      Ankle eversion       (Blank rows = not tested)   GAIT: Distance walked: 100+ feet Assistive device utilized: Environmental consultant - 2  wheeled Level of assistance: Modified independence Comments: Step-to gait       TODAY'S TREATMENT: 05/22/2022 Quadriceps sets with heel prop 2 sets of 10 for 5 seconds Tailgate knee flexion 1 minute Knee flexion AAROM (L pushes R into flexion) 10X 10 seconds *Seated knee extension stretch (hip in neutral-toes up) 3 minutes (HEP only, prone in clinic) Prone knee extension stretch with rolled up towels above knees 3 minutes Recumbent bike Seat 7 for AAROM (forward and back) 8 minutes  Functional Activities (for sit to stand and stairs) Leg Press double leg full extension (3 second hold) to as much flexion as possible 15X 50# slow eccentrics Single leg 25# 10X same hold with flexion and extension  Vaso 10 minutes R knee Medium 34*   05/21/22 -Nu step X 8 min L4 UE/LE -Slantboard stretch 30 sec X 3 bilat for gastrocs -Standing heel and toe raises X 15 bilat with UE support -Standing hip abductions X 15 bilat with UE support -Standing hip marches X 15 bilat with UE support -Seated knee flexion stretch AAROM 10 sec X 10 -seated LAQ with contralateral knee flexion then repeat on opposide side X 2 min -Supine quad set 5 sec 2X10 -Supine TKE stretch first 3 minutes of vaso  -Manual therapy: Rt knee PROM with overpressure into flexion and extension to tolerance, manual hamstring   stretching and gastroc stretching 30 sec X 3 each  Modalities: Vasopneumatic device X 10 min, medium compression, 34 deg to reduce edema and inflammation   05/20/2022 Quadriceps sets with heel prop 2 sets of 10 for 5 seconds Tailgate knee flexion 1 minute Knee flexion AAROM (L pushes R into flexion) 10X 10 seconds Seated knee extension stretch (hip in neutral-toes up) 3 minutes     PATIENT EDUCATION:  Education details: HEP, see above Person educated: Patient and Child(ren) Education method: Explanation, Demonstration, Tactile cues, Verbal cues, and Handouts Education comprehension: verbalized  understanding, returned demonstration, verbal cues required, tactile cues required, and needs further education     HOME EXERCISE PROGRAM: Access Code: [email or web address] URL: [email or web address] Date: 05/22/2022 Prepared by: Vista Mink  Exercises - Supine Quadricep Sets  - 5 x daily - 7 x weekly - 2 sets - 10 reps - 5 second hold - Seated Knee Flexion AAROM  - 3-5 x daily - 7 x  weekly - 1 sets - 1 reps - 3 minutes hold - Seated Knee Extension Stretch with Chair  - 3-5 x daily - 7 x weekly - 1 sets - 1 reps - 3-5 minutes hold - Prone Knee Extension with Ankle Weight  - 3 x daily - 7 x weekly - 1 sets - 1 reps - 3-5 minutes hold    ASSESSMENT:   CLINICAL IMPRESSION: Angela Campos reports good early HEP compliance.  I did recommend she take an oxycodone before PT due to her pain with exercises (pain limited AROM).  Added prone knee extension stretch to her HEP and also recommended she ice before bed to help with sleep.  Continue current POC.      OBJECTIVE IMPAIRMENTS Abnormal gait, decreased activity tolerance, decreased endurance, decreased knowledge of condition, decreased mobility, difficulty walking, decreased ROM, decreased strength, increased edema, impaired perceived functional ability, and pain.    ACTIVITY LIMITATIONS bending, standing, squatting, sleeping, stairs, bed mobility, and locomotion level   PARTICIPATION LIMITATIONS: driving, community activity, and occupation   PERSONAL FACTORS  No other factors  are also affecting patient's functional outcome.    REHAB POTENTIAL: Good   CLINICAL DECISION MAKING: Stable/uncomplicated   EVALUATION COMPLEXITY: Low     GOALS: Goals reviewed with patient? Yes   SHORT TERM GOALS: Target date: 06/17/2022  Improve R knee AROM to 5 to 90 degrees Baseline: 13 to 53 degrees Goal status: On Going 8 - 65 05/22/2022   2.  Marciel will transition to a cane for walking Baseline: Wheeled walker Goal status: [place] Going 05/22/2022   LONG  TERM GOALS: Target date: 07/15/2022    Improve FOTO to 64 Baseline: 36 Goal status: INITIAL   2.  Improve R knee pain to 0-3/10 on the Numeric Pain Rating Scale Baseline: 3-7/10 Goal status: INITIAL   3.  Improve R quadriceps strength as assessed by FOTO, walking without an AD and objective measures Baseline:  36 FOTO, walker and 3-/5 MMT Goal status: INITIAL   4.  Improve R knee AROM to 3 to 110 degrees or better Baseline: 13 to 53 degrees Goal status: INITIAL   5.  Mehlani will be independent with her HEP at DC Baseline: Started 05/20/2022 Goal status: INITIAL     PLAN: PT FREQUENCY: 3x/week   PT DURATION: 8 weeks   PLANNED INTERVENTIONS: Therapeutic exercises, Therapeutic activity, Neuromuscular re-education, Balance training, Gait training, Patient/Family education, Joint mobilization, Stair training, Electrical stimulation, Cryotherapy, Vasopneumatic device, and Manual therapy   PLAN FOR NEXT SESSION: AROM (flexion and extension), quadriceps strength and edema control.  Bike for [employer].  Vaso to end.  Heavy emphasis on full extension and improving very limited flexion AROM.    Farley Ly, PT, MPT 05/22/2022, 8:51 AM

## 2022-05-27 ENCOUNTER — Ambulatory Visit (INDEPENDENT_AMBULATORY_CARE_PROVIDER_SITE_OTHER): Payer: BC Managed Care – PPO | Admitting: Physical Therapy

## 2022-05-27 ENCOUNTER — Encounter: Payer: Self-pay | Admitting: Physical Therapy

## 2022-05-27 DIAGNOSIS — M25661 Stiffness of right knee, not elsewhere classified: Secondary | ICD-10-CM

## 2022-05-27 DIAGNOSIS — M6281 Muscle weakness (generalized): Secondary | ICD-10-CM | POA: Diagnosis not present

## 2022-05-27 DIAGNOSIS — R600 Localized edema: Secondary | ICD-10-CM

## 2022-05-27 DIAGNOSIS — R262 Difficulty in walking, not elsewhere classified: Secondary | ICD-10-CM | POA: Diagnosis not present

## 2022-05-27 DIAGNOSIS — M25561 Pain in right knee: Secondary | ICD-10-CM

## 2022-05-29 ENCOUNTER — Ambulatory Visit (INDEPENDENT_AMBULATORY_CARE_PROVIDER_SITE_OTHER): Payer: BC Managed Care – PPO | Admitting: Rehabilitative and Restorative Service Providers"

## 2022-05-29 ENCOUNTER — Encounter: Payer: Self-pay | Admitting: Rehabilitative and Restorative Service Providers"

## 2022-05-29 DIAGNOSIS — R262 Difficulty in walking, not elsewhere classified: Secondary | ICD-10-CM | POA: Diagnosis not present

## 2022-05-29 DIAGNOSIS — M6281 Muscle weakness (generalized): Secondary | ICD-10-CM | POA: Diagnosis not present

## 2022-05-29 DIAGNOSIS — M25661 Stiffness of right knee, not elsewhere classified: Secondary | ICD-10-CM | POA: Diagnosis not present

## 2022-05-29 DIAGNOSIS — R600 Localized edema: Secondary | ICD-10-CM | POA: Diagnosis not present

## 2022-05-29 DIAGNOSIS — M25561 Pain in right knee: Secondary | ICD-10-CM

## 2022-05-29 NOTE — Therapy (Signed)
OUTPATIENT PHYSICAL THERAPY TREATMENT NOTE   Patient Name: Angela Campos MRN: [id number] DOB:12/10/1970, 51 y.o., female Today's Date: 05/29/2022   END OF SESSION:   PT End of Session - 05/29/22 0847     Visit Number 5    Number of Visits 22    Authorization - Number of Visits 22    PT Start Time 0802    PT Stop Time 0855    PT Time Calculation (min) 53 min    Activity Tolerance Patient tolerated treatment well;Patient limited by pain    Behavior During Therapy [facility] for tasks assessed/performed             History reviewed. No pertinent past medical history. Past Surgical History:  Procedure Laterality Date   CESAREAN SECTION     1999 and 2001   DILATION AND CURETTAGE OF UTERUS  2001   KNEE CARTILAGE SURGERY Right    due to meniscus tears   TOOTH EXTRACTION     TOTAL KNEE ARTHROPLASTY Right 05/01/2022   Procedure: RIGHT TOTAL KNEE ARTHROPLASTY;  Surgeon: Mcarthur Rossetti, MD;  Location: [place];  Service: Orthopedics;  Laterality: Right;   Patient Active Problem List   Diagnosis Date Noted   OA (osteoarthritis) of knee 05/01/2022   Status post total right knee replacement 05/01/2022   Status post arthroscopy of right knee 03/04/2018   Unilateral primary osteoarthritis, right knee 03/04/2018   Acute pain of right knee 01/27/2018     THERAPY DIAG:  Difficulty in walking, not elsewhere classified  Muscle weakness (generalized)  Localized edema  Stiffness of right knee, not elsewhere classified  Right knee pain, unspecified chronicity  REFERRING PROVIDER: Mcarthur Rossetti, MD   REFERRING DIAG: [phone] (ICD-10-CM) - Status post total right knee replacement    Rationale for Evaluation and Treatment Rehabilitation   ONSET DATE: Surgery 05/01/2022   SUBJECTIVE:    SUBJECTIVE STATEMENT: Angela Campos is sleeping about 5 hours uninterrupted with an oxycodone before bed.  She takes an oxycodone before PT and otherwise uses an Advil PRN.   PERTINENT  HISTORY: Previous R knee surgery (meniscus)   PAIN:  Are you having pain? Yes: NPRS scale: This week 2-7/10 Pain location: R knee Pain description: Achy, can be sharp Aggravating factors: Too much WB or prolonged positions (after 5 hours of sleep) Relieving factors: Ice and pain medications   PRECAUTIONS: None   WEIGHT BEARING RESTRICTIONS No   FALLS:  Has patient fallen in last 6 months? No   LIVING ENVIRONMENT: Lives with: lives with their family Lives in: House/apartment Stairs: Yes: Internal: 15-25 steps; on left going up Has following equipment at home: [name] - 2 wheeled   OCCUPATION: Engineer, maintenance (IT) full-time   PLOF: Independent   PATIENT GOALS: Return to normal function.  Walking and work.     OBJECTIVE:    DIAGNOSTIC FINDINGS: IMPRESSION: Status post total right knee arthroplasty without evidence of hardware failure.  PATIENT SURVEYS:  FOTO 05/20/2022:  36 (Goal 64 in 16 visits)       SENSATION: No complaints of peripheral pains or paresthesias   EDEMA:  Noted but not measured   LOWER EXTREMITY ROM:   Active ROM Right 05/20/22 eval Left 05/20/22 eval Right 05/22/22 Right 05/29/22  Hip flexion        Hip extension        Hip abduction        Hip adduction        Hip internal rotation  Hip external rotation        Knee flexion 53 133 65 78  Knee extension '13 1 8 6  '$ Ankle dorsiflexion        Ankle plantarflexion        Ankle inversion        Ankle eversion         (Blank rows = not tested)   LOWER EXTREMITY MMT:   MMT Right eval Left eval  Hip flexion      Hip extension      Hip abduction      Hip adduction      Hip internal rotation      Hip external rotation      Knee flexion      Knee extension 3-/5 5/5  Ankle dorsiflexion      Ankle plantarflexion      Ankle inversion      Ankle eversion       (Blank rows = not tested)   GAIT: Distance walked: 100+ feet Assistive device utilized: Environmental consultant - 2 wheeled Level of assistance: Modified  independence Comments: Step-to gait       TODAY'S TREATMENT: 05/29/2022 Quadriceps sets with heel prop 2 sets of 10 for 5 seconds Tailgate knee flexion 1 minute Knee flexion AAROM (L pushes R into flexion) 10X 10 seconds *Seated knee extension stretch (hip in neutral-toes up) 3 minutes (HEP only, prone in clinic) Prone knee extension stretch with rolled up towels above knees 3 minutes 1.5# Recumbent bike Seat 7 for AAROM (forward and back) 8 minutes  Functional Activities (for sit to stand and stairs) Leg Press double leg full extension (3 second hold) to as much flexion as possible 15X 62# slow eccentrics Single leg 31# 10X same hold with flexion and extension  Vaso 10 minutes R knee Medium 34*   05/27/2022 Therapeutic Exercise: Nustep seat 9 level 5 with BLEs & BUEs for 10 min Seated heel slides on pillow case with 5 sec hold ext / quad set & max active flexion then LLE assist to increase range 10 reps. Demo using tennis ball for carpet like her work. Pt performed 10 reps with tennis ball for flexion.   Self-care: PT demo & verbal cues on sitting with knee ext and knee flexed changing positions while working.  Avoid sitting with knee in midrange position or holding one position for long periods. Pt verbalized understanding.  PT demo & verbal cues on positioning in bed for improved sleep using pillows to "tent" or lift sheets off feet. Pt verbalized understanding. Pt questioning when she can drive. PT discussed need to be off pain meds that prohibit driving & able to move RLE gas to brake.  PT recommended sitting behind wheel with car in park moving RLE gas to brake.  Pt verbalized understanding.   Therapeutic Activities: PT instructed in sit to /from stand technique using RLE. Pt return demo understanding 10 reps.  PT demo & verbal cues on weight shifting onto RLE for 2-3 sec >/= 5 reps upon arising prior to gait. Pt return demo with improved comfort & less antalgic pattern for  initial gait.  PT demo & verbal cues on gait with stance knee ext pelvic with shift onto RLE & knee flex for swing initiating terminal stance. Pt ambulated with RW with improved RLE mechanics.   Manual Therapy: PROM with overpressure & gentle mobs for knee flexion seated & ext supine.   Modalities: Vaso right knee elevation medium compression 34* for 10  min Alphabet with foot for muscle activity to facilitate edema / fluid return.     05/22/2022 Quadriceps sets with heel prop 2 sets of 10 for 5 seconds Tailgate knee flexion 1 minute Knee flexion AAROM (L pushes R into flexion) 10X 10 seconds *Seated knee extension stretch (hip in neutral-toes up) 3 minutes (HEP only, prone in clinic) Prone knee extension stretch with rolled up towels above knees 3 minutes Recumbent bike Seat 7 for AAROM (forward and back) 8 minutes  Functional Activities (for sit to stand and stairs) Leg Press double leg full extension (3 second hold) to as much flexion as possible 15X 50# slow eccentrics Single leg 25# 10X same hold with flexion and extension  Vaso 10 minutes R knee Medium 34*     PATIENT EDUCATION:  Education details: HEP, see above Person educated: Patient and Child(ren) Education method: Explanation, Demonstration, Tactile cues, Verbal cues, and Handouts Education comprehension: verbalized understanding, returned demonstration, verbal cues required, tactile cues required, and needs further education     HOME EXERCISE PROGRAM: Access Code: [email or web address] URL: [email or web address] Date: 05/22/2022 Prepared by: Vista Mink  Exercises - Supine Quadricep Sets  - 5 x daily - 7 x weekly - 2 sets - 10 reps - 5 second hold - Seated Knee Flexion AAROM  - 3-5 x daily - 7 x weekly - 1 sets - 1 reps - 3 minutes hold - Seated Knee Extension Stretch with Chair  - 3-5 x daily - 7 x weekly - 1 sets - 1 reps - 3-5 minutes hold - Prone Knee Extension with Ankle Weight  - 3 x daily - 7 x weekly -  1 sets - 1 reps - 3-5 minutes hold    ASSESSMENT:   CLINICAL IMPRESSION: Zunairah continues to make gains with her AROM post-TKA.  AROM, quadriceps strength and edema control remain the early emphasis.  Continue current POC.   OBJECTIVE IMPAIRMENTS Abnormal gait, decreased activity tolerance, decreased endurance, decreased knowledge of condition, decreased mobility, difficulty walking, decreased ROM, decreased strength, increased edema, impaired perceived functional ability, and pain.    ACTIVITY LIMITATIONS bending, standing, squatting, sleeping, stairs, bed mobility, and locomotion level   PARTICIPATION LIMITATIONS: driving, community activity, and occupation   PERSONAL FACTORS  No other factors  are also affecting patient's functional outcome.    REHAB POTENTIAL: Good   CLINICAL DECISION MAKING: Stable/uncomplicated   EVALUATION COMPLEXITY: Low     GOALS: Goals reviewed with patient? Yes   SHORT TERM GOALS: Target date: 06/17/2022  Improve R knee AROM to 5 to 90 degrees Baseline: 13 to 53 degrees Goal status: On Going 6 - 78 05/29/2022   2.  Carlon will transition to a cane for walking Baseline: Wheeled walker Goal status: Ion Going 05/29/2022   LONG TERM GOALS: Target date: 07/15/2022    Improve FOTO to 64 Baseline: 36 Goal status: INITIAL   2.  Improve R knee pain to 0-3/10 on the Numeric Pain Rating Scale Baseline: 3-7/10 Goal status: On Going 05/29/2022   3.  Improve R quadriceps strength as assessed by FOTO, walking without an AD and objective measures Baseline:  36 FOTO, walker and 3-/5 MMT Goal status: INITIAL   4.  Improve R knee AROM to 3 to 110 degrees or better Baseline: 13 to 53 degrees Goal status: On Going 05/29/2022   5.  Jessabelle will be independent with her HEP at DC Baseline: Started 05/20/2022 Goal status: INITIAL     PLAN: PT FREQUENCY: 3x/week  PT DURATION: 8 weeks   PLANNED INTERVENTIONS: Therapeutic exercises, Therapeutic activity,  Neuromuscular re-education, Balance training, Gait training, Patient/Family education, Joint mobilization, Stair training, Electrical stimulation, Cryotherapy, Vasopneumatic device, and Manual therapy   PLAN FOR NEXT SESSION: AROM (flexion and extension), quadriceps strength (increase emphasis on function), edema control, vaso.    Farley Ly, PT, MPT 05/29/2022, 8:48 AM

## 2022-05-30 ENCOUNTER — Ambulatory Visit (INDEPENDENT_AMBULATORY_CARE_PROVIDER_SITE_OTHER): Payer: BC Managed Care – PPO | Admitting: Rehabilitative and Restorative Service Providers"

## 2022-05-30 ENCOUNTER — Encounter: Payer: Self-pay | Admitting: Rehabilitative and Restorative Service Providers"

## 2022-05-30 DIAGNOSIS — R262 Difficulty in walking, not elsewhere classified: Secondary | ICD-10-CM

## 2022-05-30 DIAGNOSIS — R600 Localized edema: Secondary | ICD-10-CM | POA: Diagnosis not present

## 2022-05-30 DIAGNOSIS — M25561 Pain in right knee: Secondary | ICD-10-CM

## 2022-05-30 DIAGNOSIS — M6281 Muscle weakness (generalized): Secondary | ICD-10-CM | POA: Diagnosis not present

## 2022-05-30 DIAGNOSIS — M25661 Stiffness of right knee, not elsewhere classified: Secondary | ICD-10-CM | POA: Diagnosis not present

## 2022-05-30 NOTE — Therapy (Signed)
[place], [place], [address] Phone: [phone]   Fax:  [phone]  Patient Details  Name: Angela Campos MRN: [id number] Date of Birth: 08-09-1971 Referring Provider:  Mcarthur Rossetti*  Encounter Date: 05/30/2022   OUTPATIENT PHYSICAL THERAPY TREATMENT NOTE   Patient Name: Angela Campos MRN: [id number] DOB:1971/05/18, 51 y.o., female Today's Date: 05/30/2022   END OF SESSION:   PT End of Session - 05/30/22 0804     Visit Number 6    Number of Visits 22    PT Start Time 0800    PT Stop Time 0856    PT Time Calculation (min) 56 min    Activity Tolerance Patient tolerated treatment well;Patient limited by pain    Behavior During Therapy [facility] for tasks assessed/performed              History reviewed. No pertinent past medical history. Past Surgical History:  Procedure Laterality Date   CESAREAN SECTION     1999 and 2001   DILATION AND CURETTAGE OF UTERUS  2001   KNEE CARTILAGE SURGERY Right    due to meniscus tears   TOOTH EXTRACTION     TOTAL KNEE ARTHROPLASTY Right 05/01/2022   Procedure: RIGHT TOTAL KNEE ARTHROPLASTY;  Surgeon: Mcarthur Rossetti, MD;  Location: [place];  Service: Orthopedics;  Laterality: Right;   Patient Active Problem List   Diagnosis Date Noted   OA (osteoarthritis) of knee 05/01/2022   Status post total right knee replacement 05/01/2022   Status post arthroscopy of right knee 03/04/2018   Unilateral primary osteoarthritis, right knee 03/04/2018   Acute pain of right knee 01/27/2018     THERAPY DIAG:  Difficulty in walking, not elsewhere classified  Muscle weakness (generalized)  Localized edema  Stiffness of right knee, not elsewhere classified  Right knee pain, unspecified chronicity  REFERRING PROVIDER: Mcarthur Rossetti, MD   REFERRING DIAG: [phone] (ICD-10-CM) - Status post total right knee replacement    Rationale for Evaluation and Treatment  Rehabilitation   ONSET DATE: Surgery 05/01/2022   SUBJECTIVE:    SUBJECTIVE STATEMENT: 2/10 pain reported this morning. "My knee continues to hurt on the inside" and RTD 06/16/22.    PERTINENT HISTORY: Previous R knee surgery (meniscus)   PAIN:  Are you having pain? Yes: NPRS scale: This AM 2/10 Pain location: R knee Pain description: Achy, can be sharp Aggravating factors: Too much WB or prolonged positions (after 5 hours of sleep) Relieving factors: Ice and pain medications   PRECAUTIONS: None   WEIGHT BEARING RESTRICTIONS No   FALLS:  Has patient fallen in last 6 months? No   LIVING ENVIRONMENT: Lives with: lives with their family Lives in: House/apartment Stairs: Yes: Internal: 15-25 steps; on left going up Has following equipment at home: [name] - 2 wheeled   OCCUPATION: Engineer, maintenance (IT) full-time   PLOF: Independent   PATIENT GOALS: Return to normal function.  Walking and work.     OBJECTIVE:    DIAGNOSTIC FINDINGS: IMPRESSION: Status post total right knee arthroplasty without evidence of hardware failure.  PATIENT SURVEYS:  FOTO 05/20/2022:  36 (Goal 64 in 16 visits)       SENSATION: No complaints of peripheral pains or paresthesias   EDEMA:  Noted but not measured   LOWER EXTREMITY ROM:   Active ROM Right 05/20/22 eval Left 05/20/22 eval Right 05/22/22 Right 05/29/22  Hip flexion        Hip extension  Hip abduction        Hip adduction        Hip internal rotation        Hip external rotation        Knee flexion 53 133 65 78  Knee extension '13 1 8 6  '$ Ankle dorsiflexion        Ankle plantarflexion        Ankle inversion        Ankle eversion         (Blank rows = not tested)   LOWER EXTREMITY MMT:   MMT Right eval Left eval  Hip flexion      Hip extension      Hip abduction      Hip adduction      Hip internal rotation      Hip external rotation      Knee flexion      Knee extension 3-/5 5/5  Ankle dorsiflexion      Ankle  plantarflexion      Ankle inversion      Ankle eversion       (Blank rows = not tested)   GAIT: Distance walked: 100+ feet Assistive device utilized: Environmental consultant - 2 wheeled Level of assistance: Modified independence Comments: Step-to gait       TODAY'S TREATMENT:  [facility] Adult PT Treatment:                                                DATE: 05/30/22 Therapeutic Exercise: NuStep bil UE/LEs level 5 x 10 min with PT verbal cues for flexing right knee as much as possible and discussing functional mobility STS from EOB 2x10 with concentration on max flex possible and equalizing weightshift as much as possible with minimal compensations; pt with difficulty performing but able to do Quad sets x 20 with PT max verbal cues for not using glute set to complete Assisted heel slides with PT manually holding foot in final position for 1 min each Bridge with R knee in max flexion as tolerated x 10 reps 4 bouts with oscillations between each bout to release SAQ 2x15 with ice pack with 1 sec hold for each Modalities: Ice pack x 10 min R knee on bolster   05/29/2022 Quadriceps sets with heel prop 2 sets of 10 for 5 seconds Tailgate knee flexion 1 minute Knee flexion AAROM (L pushes R into flexion) 10X 10 seconds *Seated knee extension stretch (hip in neutral-toes up) 3 minutes (HEP only, prone in clinic) Prone knee extension stretch with rolled up towels above knees 3 minutes 1.5# Recumbent bike Seat 7 for AAROM (forward and back) 8 minutes  Functional Activities (for sit to stand and stairs) Leg Press double leg full extension (3 second hold) to as much flexion as possible 15X 62# slow eccentrics Single leg 31# 10X same hold with flexion and extension  Vaso 10 minutes R knee Medium 34*   05/27/2022 Therapeutic Exercise: Nustep seat 9 level 5 with BLEs & BUEs for 10 min Seated heel slides on pillow case with 5 sec hold ext / quad set & max active flexion then LLE assist to increase range 10 reps.  Demo using tennis ball for carpet like her work. Pt performed 10 reps with tennis ball for flexion.   Self-care: PT demo & verbal cues on sitting with knee ext  and knee flexed changing positions while working.  Avoid sitting with knee in midrange position or holding one position for long periods. Pt verbalized understanding.  PT demo & verbal cues on positioning in bed for improved sleep using pillows to "tent" or lift sheets off feet. Pt verbalized understanding. Pt questioning when she can drive. PT discussed need to be off pain meds that prohibit driving & able to move RLE gas to brake.  PT recommended sitting behind wheel with car in park moving RLE gas to brake.  Pt verbalized understanding.   Therapeutic Activities: PT instructed in sit to /from stand technique using RLE. Pt return demo understanding 10 reps.  PT demo & verbal cues on weight shifting onto RLE for 2-3 sec >/= 5 reps upon arising prior to gait. Pt return demo with improved comfort & less antalgic pattern for initial gait.  PT demo & verbal cues on gait with stance knee ext pelvic with shift onto RLE & knee flex for swing initiating terminal stance. Pt ambulated with RW with improved RLE mechanics.   Manual Therapy: PROM with overpressure & gentle mobs for knee flexion seated & ext supine.   Modalities: Vaso right knee elevation medium compression 34* for 10 min Alphabet with foot for muscle activity to facilitate edema / fluid return.     05/22/2022 Quadriceps sets with heel prop 2 sets of 10 for 5 seconds Tailgate knee flexion 1 minute Knee flexion AAROM (L pushes R into flexion) 10X 10 seconds *Seated knee extension stretch (hip in neutral-toes up) 3 minutes (HEP only, prone in clinic) Prone knee extension stretch with rolled up towels above knees 3 minutes Recumbent bike Seat 7 for AAROM (forward and back) 8 minutes  Functional Activities (for sit to stand and stairs) Leg Press double leg full extension (3 second  hold) to as much flexion as possible 15X 50# slow eccentrics Single leg 25# 10X same hold with flexion and extension  Vaso 10 minutes R knee Medium 34*     PATIENT EDUCATION:  Education details: HEP, see above Person educated: Patient and Child(ren) Education method: Explanation, Demonstration, Tactile cues, Verbal cues, and Handouts Education comprehension: verbalized understanding, returned demonstration, verbal cues required, tactile cues required, and needs further education     HOME EXERCISE PROGRAM: Access Code: [email or web address] URL: [email or web address] Date: 05/22/2022 Prepared by: Vista Mink  Exercises - Supine Quadricep Sets  - 5 x daily - 7 x weekly - 2 sets - 10 reps - 5 second hold - Seated Knee Flexion AAROM  - 3-5 x daily - 7 x weekly - 1 sets - 1 reps - 3 minutes hold - Seated Knee Extension Stretch with Chair  - 3-5 x daily - 7 x weekly - 1 sets - 1 reps - 3-5 minutes hold - Prone Knee Extension with Ankle Weight  - 3 x daily - 7 x weekly - 1 sets - 1 reps - 3-5 minutes hold    ASSESSMENT:   CLINICAL IMPRESSION: Angela Campos continues to make gains with her AROM post-TKA.  AROM, quadriceps strength and edema control remain the early emphasis.  Continue current POC. Pt reports that she continues to have R medial knee pain with knee flexion exercises. PT advised her to continue to ice x 10-15 min and perform HEP at home and concentration on knee flexion as much as tolerated with STS transfers to work on functional ROM/strengthening.    OBJECTIVE IMPAIRMENTS Abnormal gait, decreased activity tolerance, decreased endurance, decreased knowledge of condition, decreased  mobility, difficulty walking, decreased ROM, decreased strength, increased edema, impaired perceived functional ability, and pain.    ACTIVITY LIMITATIONS bending, standing, squatting, sleeping, stairs, bed mobility, and locomotion level   PARTICIPATION LIMITATIONS: driving, community activity, and  occupation   PERSONAL FACTORS  No other factors  are also affecting patient's functional outcome.    REHAB POTENTIAL: Good   CLINICAL DECISION MAKING: Stable/uncomplicated   EVALUATION COMPLEXITY: Low     GOALS: Goals reviewed with patient? Yes   SHORT TERM GOALS: Target date: 06/17/2022  Improve R knee AROM to 5 to 90 degrees Baseline: 13 to 53 degrees Goal status: On Going 6 - 78 05/29/2022   2.  Angela Campos will transition to a cane for walking Baseline: Wheeled walker Goal status: Ion Going 05/29/2022   LONG TERM GOALS: Target date: 07/15/2022    Improve FOTO to 64 Baseline: 36 Goal status: INITIAL   2.  Improve R knee pain to 0-3/10 on the Numeric Pain Rating Scale Baseline: 3-7/10 Goal status: On Going 05/29/2022   3.  Improve R quadriceps strength as assessed by FOTO, walking without an AD and objective measures Baseline:  36 FOTO, walker and 3-/5 MMT Goal status: INITIAL   4.  Improve R knee AROM to 3 to 110 degrees or better Baseline: 13 to 53 degrees Goal status: On Going 05/29/2022   5.  Angela Campos will be independent with her HEP at DC Baseline: Started 05/20/2022 Goal status: INITIAL     PLAN: PT FREQUENCY: 3x/week   PT DURATION: 8 weeks   PLANNED INTERVENTIONS: Therapeutic exercises, Therapeutic activity, Neuromuscular re-education, Balance training, Gait training, Patient/Family education, Joint mobilization, Stair training, Electrical stimulation, Cryotherapy, Vasopneumatic device, and Manual therapy   PLAN FOR NEXT SESSION: AROM (flexion and extension), quadriceps strength (increase emphasis on function), edema control, vaso.    America Brown, PT, MPT 05/30/2022, 9:00 AM      America Brown, PT 05/30/2022, 9:00 AM  [facility] [address] [place], [place], [address] Phone: [phone]   Fax:  [phone]

## 2022-06-04 ENCOUNTER — Encounter: Payer: Self-pay | Admitting: Rehabilitative and Restorative Service Providers"

## 2022-06-04 ENCOUNTER — Ambulatory Visit (INDEPENDENT_AMBULATORY_CARE_PROVIDER_SITE_OTHER): Payer: BC Managed Care – PPO | Admitting: Rehabilitative and Restorative Service Providers"

## 2022-06-04 DIAGNOSIS — M25661 Stiffness of right knee, not elsewhere classified: Secondary | ICD-10-CM

## 2022-06-04 DIAGNOSIS — M6281 Muscle weakness (generalized): Secondary | ICD-10-CM

## 2022-06-04 DIAGNOSIS — R262 Difficulty in walking, not elsewhere classified: Secondary | ICD-10-CM | POA: Diagnosis not present

## 2022-06-04 DIAGNOSIS — R600 Localized edema: Secondary | ICD-10-CM

## 2022-06-04 DIAGNOSIS — M25561 Pain in right knee: Secondary | ICD-10-CM

## 2022-06-04 NOTE — Therapy (Signed)
[place], [place], [address] Phone: [phone]   Fax:  [phone]  Patient Details  Name: Angela Campos MRN: [id number] Date of Birth: 13-Oct-1971 Referring Provider:  No ref. provider found  Encounter Date: 06/04/2022   OUTPATIENT PHYSICAL THERAPY TREATMENT NOTE   Patient Name: Angela Campos MRN: [id number] DOB:1971/03/12, 51 y.o., female Today's Date: 06/04/2022   END OF SESSION:   PT End of Session - 06/04/22 1612     Visit Number 7    Number of Visits 22    Authorization - Number of Visits 22    PT Start Time 1605    PT Stop Time 1655    PT Time Calculation (min) 50 min    Activity Tolerance Patient tolerated treatment well    Behavior During Therapy [facility] for tasks assessed/performed             History reviewed. No pertinent past medical history. Past Surgical History:  Procedure Laterality Date   CESAREAN SECTION     1999 and 2001   DILATION AND CURETTAGE OF UTERUS  2001   KNEE CARTILAGE SURGERY Right    due to meniscus tears   TOOTH EXTRACTION     TOTAL KNEE ARTHROPLASTY Right 05/01/2022   Procedure: RIGHT TOTAL KNEE ARTHROPLASTY;  Surgeon: Mcarthur Rossetti, MD;  Location: [place];  Service: Orthopedics;  Laterality: Right;   Patient Active Problem List   Diagnosis Date Noted   OA (osteoarthritis) of knee 05/01/2022   Status post total right knee replacement 05/01/2022   Status post arthroscopy of right knee 03/04/2018   Unilateral primary osteoarthritis, right knee 03/04/2018   Acute pain of right knee 01/27/2018     THERAPY DIAG:  Difficulty in walking, not elsewhere classified  Muscle weakness (generalized)  Localized edema  Stiffness of right knee, not elsewhere classified  Right knee pain, unspecified chronicity  REFERRING PROVIDER: Mcarthur Rossetti, MD   REFERRING DIAG: [phone] (ICD-10-CM) - Status post total right knee replacement    Rationale for Evaluation and  Treatment Rehabilitation   ONSET DATE: Surgery 05/01/2022   SUBJECTIVE:    SUBJECTIVE STATEMENT: Prolonged sitting at work is tough.  Sleep is interrupted although positioning is the problem rather than pain.  Good HEP compliance continues.   PERTINENT HISTORY: Previous R knee surgery (meniscus)   PAIN:  Are you having pain? Yes: NPRS scale: This week 0-5/10 Pain location: R knee Pain description: Achy, can be sharp Aggravating factors: Too much WB or prolonged positions (after 5 hours of sleep) Relieving factors: Ice and pain medications   PRECAUTIONS: None   WEIGHT BEARING RESTRICTIONS No   FALLS:  Has patient fallen in last 6 months? No   LIVING ENVIRONMENT: Lives with: lives with their family Lives in: House/apartment Stairs: Yes: Internal: 15-25 steps; on left going up Has following equipment at home: [name] - 2 wheeled   OCCUPATION: Engineer, maintenance (IT) full-time   PLOF: Independent   PATIENT GOALS: Return to normal function.  Walking and work.     OBJECTIVE:    DIAGNOSTIC FINDINGS: IMPRESSION: Status post total right knee arthroplasty without evidence of hardware failure.  PATIENT SURVEYS:  FOTO 05/20/2022:  36 (Goal 64 in 16 visits)       SENSATION: No complaints of peripheral pains or paresthesias   EDEMA:  Noted but not measured   LOWER EXTREMITY ROM:   Active ROM Right 05/20/22 eval Left 05/20/22 eval Right 05/22/22 Right 05/29/22 Right 06/04/2022  Hip flexion  Hip extension         Hip abduction         Hip adduction         Hip internal rotation         Hip external rotation         Knee flexion 53 133 65 78 87  Knee extension '13 1 8 6 5  ' Ankle dorsiflexion         Ankle plantarflexion         Ankle inversion         Ankle eversion          (Blank rows = not tested)   LOWER EXTREMITY MMT:   MMT Right eval Left eval  Hip flexion      Hip extension      Hip abduction      Hip adduction      Hip internal rotation      Hip external rotation       Knee flexion      Knee extension 3-/5 5/5  Ankle dorsiflexion      Ankle plantarflexion      Ankle inversion      Ankle eversion       (Blank rows = not tested)   GAIT: Distance walked: 100+ feet Assistive device utilized: Environmental consultant - 2 wheeled Level of assistance: Modified independence Comments: Step-to gait       TODAY'S TREATMENT: 06/04/2022 Quadriceps sets with heel prop 2 sets of 10 for 5 seconds Tailgate knee flexion 1 minute Knee flexion AAROM (L pushes R into flexion) 10X 10 seconds *Seated knee extension stretch (hip in neutral-toes up) 3 minutes (HEP only, prone in clinic) Prone knee extension stretch with rolled up towels above knees 3 minutes 1.5# Recumbent bike Seat 7 for AAROM (forward and back) 5 minutes  Functional Activities (for sit to stand and stairs) Leg Press double leg full extension (3 second hold) to as much flexion as possible 15X 75# slow eccentrics Single leg 37# 10X same hold with flexion and extension Gait with cane (proper cane height and sequencing)  Vaso 5 minutes R knee Medium 34*   [facility] Adult PT Treatment:                                                DATE: 05/30/22 Therapeutic Exercise: NuStep bil UE/LEs level 5 x 10 min with PT verbal cues for flexing right knee as much as possible and discussing functional mobility STS from EOB 2x10 with concentration on max flex possible and equalizing weightshift as much as possible with minimal compensations; pt with difficulty performing but able to do Quad sets x 20 with PT max verbal cues for not using glute set to complete Assisted heel slides with PT manually holding foot in final position for 1 min each Bridge with R knee in max flexion as tolerated x 10 reps 4 bouts with oscillations between each bout to release SAQ 2x15 with ice pack with 1 sec hold for each Modalities: Ice pack x 10 min R knee on bolster    05/29/2022 Quadriceps sets with heel prop 2 sets of 10 for 5 seconds Tailgate knee  flexion 1 minute Knee flexion AAROM (L pushes R into flexion) 10X 10 seconds *Seated knee extension stretch (hip in neutral-toes up) 3 minutes (HEP only, prone in clinic)  Prone knee extension stretch with rolled up towels above knees 3 minutes 1.5# Recumbent bike Seat 7 for AAROM (forward and back) 8 minutes  Functional Activities (for sit to stand and stairs) Leg Press double leg full extension (3 second hold) to as much flexion as possible 15X 62# slow eccentrics Single leg 31# 10X same hold with flexion and extension  Vaso 10 minutes R knee Medium 34*    PATIENT EDUCATION:  Education details: HEP, see above Person educated: Patient and Child(ren) Education method: Explanation, Demonstration, Tactile cues, Verbal cues, and Handouts Education comprehension: verbalized understanding, returned demonstration, verbal cues required, tactile cues required, and needs further education     HOME EXERCISE PROGRAM: Access Code: [email or web address] URL: [email or web address] Date: 05/22/2022 Prepared by: Vista Mink  Exercises - Supine Quadricep Sets  - 5 x daily - 7 x weekly - 2 sets - 10 reps - 5 second hold - Seated Knee Flexion AAROM  - 3-5 x daily - 7 x weekly - 1 sets - 1 reps - 3 minutes hold - Seated Knee Extension Stretch with Chair  - 3-5 x daily - 7 x weekly - 1 sets - 1 reps - 3-5 minutes hold - Prone Knee Extension with Ankle Weight  - 3 x daily - 7 x weekly - 1 sets - 1 reps - 3-5 minutes hold    ASSESSMENT:   CLINICAL IMPRESSION: Elizeth continues to make steady gains with her AROM post-TKA.  She appears ready to transition to a cane and we spent time looking at proper cane use and height today.  AROM, quadriceps strength and edema control remain the early emphasis.  Continue current POC.    OBJECTIVE IMPAIRMENTS Abnormal gait, decreased activity tolerance, decreased endurance, decreased knowledge of condition, decreased mobility, difficulty walking, decreased ROM,  decreased strength, increased edema, impaired perceived functional ability, and pain.    ACTIVITY LIMITATIONS bending, standing, squatting, sleeping, stairs, bed mobility, and locomotion level   PARTICIPATION LIMITATIONS: driving, community activity, and occupation   PERSONAL FACTORS  No other factors  are also affecting patient's functional outcome.    REHAB POTENTIAL: Good   CLINICAL DECISION MAKING: Stable/uncomplicated   EVALUATION COMPLEXITY: Low     GOALS: Goals reviewed with patient? Yes   SHORT TERM GOALS: Target date: 06/17/2022  Improve R knee AROM to 5 to 90 degrees Baseline: 13 to 53 degrees Goal status: Partially met 5 - 87 on 06/04/2022   2.  Nanako will transition to a cane for walking Baseline: Wheeled walker Goal status: Met  06/04/2022   LONG TERM GOALS: Target date: 07/15/2022    Improve FOTO to 64 Baseline: 36 Goal status: INITIAL   2.  Improve R knee pain to 0-3/10 on the Numeric Pain Rating Scale Baseline: 3-7/10 Goal status: On Going 06/06/2022   3.  Improve R quadriceps strength as assessed by FOTO, walking without an AD and objective measures Baseline:  36 FOTO, walker and 3-/5 MMT Goal status: INITIAL   4.  Improve R knee AROM to 3 to 110 degrees or better Baseline: 13 to 53 degrees Goal status: On Going 06/04/2022   5.  Desha will be independent with her HEP at DC Baseline: Started 05/20/2022 Goal status: INITIAL     PLAN: PT FREQUENCY: 3x/week   PT DURATION: 8 weeks   PLANNED INTERVENTIONS: Therapeutic exercises, Therapeutic activity, Neuromuscular re-education, Balance training, Gait training, Patient/Family education, Joint mobilization, Stair training, Electrical stimulation, Cryotherapy, Vasopneumatic device, and Manual therapy   PLAN FOR NEXT  SESSION: AROM (flexion and extension), quadriceps strength (increase emphasis on function), edema control, vaso.  Progress balance and functional activities (steps).   Farley Ly, PT,  MPT 06/04/2022, 4:50 PM   [facility] Physical Therapy [address] [place], [place], [address] Phone: [phone]   Fax:  [phone]

## 2022-06-05 ENCOUNTER — Encounter: Payer: Self-pay | Admitting: Rehabilitative and Restorative Service Providers"

## 2022-06-05 ENCOUNTER — Ambulatory Visit (INDEPENDENT_AMBULATORY_CARE_PROVIDER_SITE_OTHER): Payer: BC Managed Care – PPO | Admitting: Rehabilitative and Restorative Service Providers"

## 2022-06-05 DIAGNOSIS — M6281 Muscle weakness (generalized): Secondary | ICD-10-CM

## 2022-06-05 DIAGNOSIS — R262 Difficulty in walking, not elsewhere classified: Secondary | ICD-10-CM | POA: Diagnosis not present

## 2022-06-05 DIAGNOSIS — R600 Localized edema: Secondary | ICD-10-CM

## 2022-06-05 DIAGNOSIS — M25561 Pain in right knee: Secondary | ICD-10-CM

## 2022-06-05 DIAGNOSIS — M25661 Stiffness of right knee, not elsewhere classified: Secondary | ICD-10-CM | POA: Diagnosis not present

## 2022-06-05 NOTE — Therapy (Signed)
[place], [place], [address] Phone: [phone]   Fax:  [phone]  Patient Details  Name: Angela Campos MRN: [id number] Date of Birth: [birth year] Referring Provider:  No ref. provider found  Encounter Date: 06/05/2022   OUTPATIENT PHYSICAL THERAPY TREATMENT NOTE   Patient Name: Angela Campos MRN: [id number] DOB:12-21-1970, 51 y.o., female Today's Date: 06/05/2022   END OF SESSION:   PT End of Session - 06/05/22 0805     Visit Number 8    Number of Visits 22    Authorization - Number of Visits 22    PT Start Time 5537    PT Stop Time 0856    PT Time Calculation (min) 57 min    Activity Tolerance Patient tolerated treatment well;No increased pain    Behavior During Therapy [facility] for tasks assessed/performed              History reviewed. No pertinent past medical history. Past Surgical History:  Procedure Laterality Date   CESAREAN SECTION     1999 and 2001   DILATION AND CURETTAGE OF UTERUS  2001   KNEE CARTILAGE SURGERY Right    due to meniscus tears   TOOTH EXTRACTION     TOTAL KNEE ARTHROPLASTY Right 05/01/2022   Procedure: RIGHT TOTAL KNEE ARTHROPLASTY;  Surgeon: Mcarthur Rossetti, MD;  Location: [place];  Service: Orthopedics;  Laterality: Right;   Patient Active Problem List   Diagnosis Date Noted   OA (osteoarthritis) of knee 05/01/2022   Status post total right knee replacement 05/01/2022   Status post arthroscopy of right knee 03/04/2018   Unilateral primary osteoarthritis, right knee 03/04/2018   Acute pain of right knee 01/27/2018     THERAPY DIAG:  Difficulty in walking, not elsewhere classified  Muscle weakness (generalized)  Localized edema  Stiffness of right knee, not elsewhere classified  Right knee pain, unspecified chronicity  REFERRING PROVIDER: Mcarthur Rossetti, MD   REFERRING DIAG: [phone] (ICD-10-CM) - Status post total right knee replacement    Rationale for  Evaluation and Treatment Rehabilitation   ONSET DATE: Surgery 05/01/2022   SUBJECTIVE:    SUBJECTIVE STATEMENT: No changes since I saw Aranda yesterday at 4 PM (8 AM appointment today).   PERTINENT HISTORY: Previous R knee surgery (meniscus)   PAIN:  Are you having pain? Yes: NPRS scale: This week 0-5/10 Pain location: R knee Pain description: Achy, can be sharp Aggravating factors: Too much WB or prolonged positions (after 5 hours of sleep) Relieving factors: Ice and pain medications   PRECAUTIONS: None   WEIGHT BEARING RESTRICTIONS No   FALLS:  Has patient fallen in last 6 months? No   LIVING ENVIRONMENT: Lives with: lives with their family Lives in: House/apartment Stairs: Yes: Internal: 15-25 steps; on left going up Has following equipment at home: [name] - 2 wheeled   OCCUPATION: Engineer, maintenance (IT) full-time   PLOF: Independent   PATIENT GOALS: Return to normal function.  Walking and work.     OBJECTIVE:    DIAGNOSTIC FINDINGS: IMPRESSION: Status post total right knee arthroplasty without evidence of hardware failure.  PATIENT SURVEYS:  FOTO 05/20/2022:  36 (Goal 64 in 16 visits)       SENSATION: No complaints of peripheral pains or paresthesias   EDEMA:  Noted but not measured   LOWER EXTREMITY ROM:   Active ROM Right 05/20/22 eval Left 05/20/22 eval Right 05/22/22 Right 05/29/22 Right 06/04/2022  Hip flexion  Hip extension         Hip abduction         Hip adduction         Hip internal rotation         Hip external rotation         Knee flexion 53 133 65 78 87  Knee extension '13 1 8 6 5  ' Ankle dorsiflexion         Ankle plantarflexion         Ankle inversion         Ankle eversion          (Blank rows = not tested)   LOWER EXTREMITY MMT:   MMT Right eval Left eval  Hip flexion      Hip extension      Hip abduction      Hip adduction      Hip internal rotation      Hip external rotation      Knee flexion      Knee extension 3-/5 5/5   Ankle dorsiflexion      Ankle plantarflexion      Ankle inversion      Ankle eversion       (Blank rows = not tested)   GAIT: Distance walked: 100+ feet Assistive device utilized: Environmental consultant - 2 wheeled Level of assistance: Modified independence Comments: Step-to gait       TODAY'S TREATMENT: 06/05/2022 (HEP only) Quadriceps sets with heel prop 2 sets of 10 for 5 seconds Tailgate knee flexion 1 minute Knee flexion AAROM (L pushes R into flexion) 10X 10 seconds (HEP only) Seated knee extension stretch (hip in neutral-toes up) 3 minutes (HEP only, prone in clinic) Prone knee extension stretch with rolled up towels above knees 3 minutes 1.5# Recumbent bike Seat 7 for AAROM (forward and back) 8 minutes  Functional Activities (for sit to stand and stairs) -Leg Press double leg full extension (3 second hold) to as much flexion as possible 15X 87# slow eccentrics -Single leg 37# 10X same hold with flexion and extension Step-up and over 2 and 4 inch step 10X each slow eccentrics Lateral step-down 10X each 2 and 4 inch step  Neuromuscular activities: Tandem baalnce eyes open; head turning and eyes closed 2X each 20 seconds and single leg stance 3X 10 seconds each  Vaso 10 minutes R knee Medium 34*   06/04/2022 Quadriceps sets with heel prop 2 sets of 10 for 5 seconds Tailgate knee flexion 1 minute Knee flexion AAROM (L pushes R into flexion) 10X 10 seconds *Seated knee extension stretch (hip in neutral-toes up) 3 minutes (HEP only, prone in clinic) Prone knee extension stretch with rolled up towels above knees 3 minutes 1.5# Recumbent bike Seat 7 for AAROM (forward and back) 5 minutes  Functional Activities (for sit to stand and stairs) Leg Press double leg full extension (3 second hold) to as much flexion as possible 15X 75# slow eccentrics Single leg 37# 10X same hold with flexion and extension Gait with cane (proper cane height and sequencing)  Vaso 5 minutes R knee Medium  34*   [facility] Adult PT Treatment:                                                DATE: 05/30/22 Therapeutic Exercise: NuStep bil UE/LEs level 5 x 10  min with PT verbal cues for flexing right knee as much as possible and discussing functional mobility STS from EOB 2x10 with concentration on max flex possible and equalizing weightshift as much as possible with minimal compensations; pt with difficulty performing but able to do Quad sets x 20 with PT max verbal cues for not using glute set to complete Assisted heel slides with PT manually holding foot in final position for 1 min each Bridge with R knee in max flexion as tolerated x 10 reps 4 bouts with oscillations between each bout to release SAQ 2x15 with ice pack with 1 sec hold for each Modalities: Ice pack x 10 min R knee on bolster      PATIENT EDUCATION:  Education details: HEP, see above Person educated: Patient and Child(ren) Education method: Explanation, Demonstration, Tactile cues, Verbal cues, and Handouts Education comprehension: verbalized understanding, returned demonstration, verbal cues required, tactile cues required, and needs further education     HOME EXERCISE PROGRAM: Access Code: [email or web address] URL: [email or web address] Date: 05/22/2022 Prepared by: Vista Mink  Exercises - Supine Quadricep Sets  - 5 x daily - 7 x weekly - 2 sets - 10 reps - 5 second hold - Seated Knee Flexion AAROM  - 3-5 x daily - 7 x weekly - 1 sets - 1 reps - 3 minutes hold - Seated Knee Extension Stretch with Chair  - 3-5 x daily - 7 x weekly - 1 sets - 1 reps - 3-5 minutes hold - Prone Knee Extension with Ankle Weight  - 3 x daily - 7 x weekly - 1 sets - 1 reps - 3-5 minutes hold    ASSESSMENT:   CLINICAL IMPRESSION: Aveena requested some balance and functional work today to complement her AROM and strength activities.  She looked good with balance while hip and quadriceps weakness were evident with step exercises.  AROM,  quadriceps strength and edema control remain the emphasis of her HEP.  Continue current POC.    OBJECTIVE IMPAIRMENTS Abnormal gait, decreased activity tolerance, decreased endurance, decreased knowledge of condition, decreased mobility, difficulty walking, decreased ROM, decreased strength, increased edema, impaired perceived functional ability, and pain.    ACTIVITY LIMITATIONS bending, standing, squatting, sleeping, stairs, bed mobility, and locomotion level   PARTICIPATION LIMITATIONS: driving, community activity, and occupation   PERSONAL FACTORS  No other factors  are also affecting patient's functional outcome.    REHAB POTENTIAL: Good   CLINICAL DECISION MAKING: Stable/uncomplicated   EVALUATION COMPLEXITY: Low     GOALS: Goals reviewed with patient? Yes   SHORT TERM GOALS: Target date: 06/17/2022  Improve R knee AROM to 5 to 90 degrees Baseline: 13 to 53 degrees Goal status: Partially met 5 - 87 on 06/04/2022   2.  Omya will transition to a cane for walking Baseline: Wheeled walker Goal status: Met  06/04/2022   LONG TERM GOALS: Target date: 07/15/2022    Improve FOTO to 64 Baseline: 36 Goal status: INITIAL   2.  Improve R knee pain to 0-3/10 on the Numeric Pain Rating Scale Baseline: 3-7/10 Goal status: On Going 06/06/2022   3.  Improve R quadriceps strength as assessed by FOTO, walking without an AD and objective measures Baseline:  36 FOTO, walker and 3-/5 MMT Goal status: INITIAL   4.  Improve R knee AROM to 3 to 110 degrees or better Baseline: 13 to 53 degrees Goal status: On Going 06/04/2022   5.  Asante will be independent with her HEP at DC Baseline:  Started 05/20/2022 Goal status: INITIAL     PLAN: PT FREQUENCY: 3x/week   PT DURATION: 8 weeks   PLANNED INTERVENTIONS: Therapeutic exercises, Therapeutic activity, Neuromuscular re-education, Balance training, Gait training, Patient/Family education, Joint mobilization, Stair training, Electrical  stimulation, Cryotherapy, Vasopneumatic device, and Manual therapy   PLAN FOR NEXT SESSION: AROM (flexion and extension), quadriceps strength (increase emphasis on function), edema control, vaso.  Progress balance and functional activities (steps) as time allows (AROM, edema control and strength remain primary concerns).   Farley Ly, PT, MPT 06/05/2022, 8:49 AM   [facility] Physical Therapy [address] [place], [place], [address] Phone: [phone]   Fax:  [phone]

## 2022-06-06 ENCOUNTER — Ambulatory Visit (INDEPENDENT_AMBULATORY_CARE_PROVIDER_SITE_OTHER): Payer: BC Managed Care – PPO | Admitting: Rehabilitative and Restorative Service Providers"

## 2022-06-06 ENCOUNTER — Encounter: Payer: Self-pay | Admitting: Rehabilitative and Restorative Service Providers"

## 2022-06-06 DIAGNOSIS — R600 Localized edema: Secondary | ICD-10-CM

## 2022-06-06 DIAGNOSIS — M25661 Stiffness of right knee, not elsewhere classified: Secondary | ICD-10-CM | POA: Diagnosis not present

## 2022-06-06 DIAGNOSIS — R262 Difficulty in walking, not elsewhere classified: Secondary | ICD-10-CM | POA: Diagnosis not present

## 2022-06-06 DIAGNOSIS — M6281 Muscle weakness (generalized): Secondary | ICD-10-CM | POA: Diagnosis not present

## 2022-06-06 DIAGNOSIS — M25561 Pain in right knee: Secondary | ICD-10-CM

## 2022-06-06 NOTE — Therapy (Signed)
[place], [place], [address] Phone: [phone]   Fax:  [phone]  Patient Details  Name: Angela Campos MRN: [id number] Date of Birth: 1971-04-08 Referring Provider:  Mcarthur Rossetti*  Encounter Date: 06/06/2022   OUTPATIENT PHYSICAL THERAPY TREATMENT NOTE   Patient Name: Angela Campos MRN: [id number] DOB:1970-12-24, 51 y.o., female Today's Date: 06/06/2022   END OF SESSION:   PT End of Session - 06/06/22 0806     Visit Number 9    Number of Visits 22    Authorization - Number of Visits 22    PT Start Time 0801    PT Stop Time 0626    PT Time Calculation (min) 56 min    Activity Tolerance Patient tolerated treatment well;No increased pain    Behavior During Therapy [facility] for tasks assessed/performed            History reviewed. No pertinent past medical history. Past Surgical History:  Procedure Laterality Date   CESAREAN SECTION     1999 and 2001   DILATION AND CURETTAGE OF UTERUS  2001   KNEE CARTILAGE SURGERY Right    due to meniscus tears   TOOTH EXTRACTION     TOTAL KNEE ARTHROPLASTY Right 05/01/2022   Procedure: RIGHT TOTAL KNEE ARTHROPLASTY;  Surgeon: Mcarthur Rossetti, MD;  Location: [facility];  Service: Orthopedics;  Laterality: Right;   Patient Active Problem List   Diagnosis Date Noted   OA (osteoarthritis) of knee 05/01/2022   Status post total right knee replacement 05/01/2022   Status post arthroscopy of right knee 03/04/2018   Unilateral primary osteoarthritis, right knee 03/04/2018   Acute pain of right knee 01/27/2018     THERAPY DIAG:  Difficulty in walking, not elsewhere classified  Muscle weakness (generalized)  Localized edema  Stiffness of right knee, not elsewhere classified  Right knee pain, unspecified chronicity  REFERRING PROVIDER: Mcarthur Rossetti, MD   REFERRING DIAG: [phone] (ICD-10-CM) - Status post total right knee replacement    Rationale for  Evaluation and Treatment Rehabilitation   ONSET DATE: Surgery 05/01/2022   SUBJECTIVE:    SUBJECTIVE STATEMENT: No changes since I saw Angela Campos yesterday at  8 AM (8 AM appointment today).   PERTINENT HISTORY: Previous R knee surgery (meniscus)   PAIN:  Are you having pain? Yes: NPRS scale: This week 0-5/10 Pain location: R knee Pain description: Achy, can be sharp Aggravating factors: Too much WB or prolonged positions (after 5 hours of sleep) Relieving factors: Ice and pain medications   PRECAUTIONS: None   WEIGHT BEARING RESTRICTIONS No   FALLS:  Has patient fallen in last 6 months? No   LIVING ENVIRONMENT: Lives with: lives with their family Lives in: House/apartment Stairs: Yes: Internal: 15-25 steps; on left going up Has following equipment at home: [name] - 2 wheeled   OCCUPATION: Engineer, maintenance (IT) full-time   PLOF: Independent   PATIENT GOALS: Return to normal function.  Walking and work.     OBJECTIVE:    DIAGNOSTIC FINDINGS: IMPRESSION: Status post total right knee arthroplasty without evidence of hardware failure.  PATIENT SURVEYS:  FOTO 05/20/2022:  36 (Goal 64 in 16 visits)       SENSATION: No complaints of peripheral pains or paresthesias   EDEMA:  Noted but not measured   LOWER EXTREMITY ROM:   Active ROM Right 05/20/22 eval Left 05/20/22 eval Right 05/22/22 Right 05/29/22 Right 06/04/2022  Hip flexion  Hip extension         Hip abduction         Hip adduction         Hip internal rotation         Hip external rotation         Knee flexion 53 133 65 78 87  Knee extension '13 1 8 6 5  ' Ankle dorsiflexion         Ankle plantarflexion         Ankle inversion         Ankle eversion          (Blank rows = not tested)   LOWER EXTREMITY MMT:   MMT Right eval Left eval  Hip flexion      Hip extension      Hip abduction      Hip adduction      Hip internal rotation      Hip external rotation      Knee flexion      Knee extension 3-/5 5/5   Ankle dorsiflexion      Ankle plantarflexion      Ankle inversion      Ankle eversion       (Blank rows = not tested)   GAIT: Distance walked: 100+ feet Assistive device utilized: Environmental consultant - 2 wheeled Level of assistance: Modified independence Comments: Step-to gait       TODAY'S TREATMENT: 06/06/2022 Quadriceps sets with heel prop 2 sets of 10 for 5 seconds Tailgate knee flexion 1 minute Knee flexion AAROM (L pushes R into flexion) 10X 10 seconds *(HEP only) Seated knee extension stretch (hip in neutral-toes up) 3 minutes (HEP only, prone in clinic) Prone knee extension stretch with rolled up towels above knees 3 minutes 1.5# Recumbent bike Seat 7 for AAROM (forward and back) 8 minutes Weight-shift stretch into flexion and extension off 8 inch step 5X each  Functional Activities (for sit to stand and stairs) Leg Press double leg full extension (3 second hold) to as much flexion as possible 15X 87# slow eccentrics Single leg 37# 10X same hold with flexion and extension Step-up and over 4 and 6 inch step 10X each slow eccentrics Lateral step-down 10X on  2 inch step  Neuromuscular activities: Tandem baalnce eyes open; head turning and eyes closed 1X each 20 seconds and single leg stance 2X 10 seconds each  Vaso 10 minutes R knee Medium 34*   06/05/2022 (HEP only) Quadriceps sets with heel prop 2 sets of 10 for 5 seconds Tailgate knee flexion 1 minute Knee flexion AAROM (L pushes R into flexion) 10X 10 seconds (HEP only) Seated knee extension stretch (hip in neutral-toes up) 3 minutes (HEP only, prone in clinic) Prone knee extension stretch with rolled up towels above knees 3 minutes 1.5# Recumbent bike Seat 7 for AAROM (forward and back) 8 minutes  Functional Activities (for sit to stand and stairs) -Leg Press double leg full extension (3 second hold) to as much flexion as possible 15X 87# slow eccentrics -Single leg 37# 10X same hold with flexion and extension Step-up and  over 2 and 4 inch step 10X each slow eccentrics Lateral step-down 10X each 2 and 4 inch step  Neuromuscular activities: Tandem baalnce eyes open; head turning and eyes closed 2X each 20 seconds and single leg stance 3X 10 seconds each  Vaso 10 minutes R knee Medium 34*   06/04/2022 Quadriceps sets with heel prop 2 sets of 10 for 5  seconds Tailgate knee flexion 1 minute Knee flexion AAROM (L pushes R into flexion) 10X 10 seconds *Seated knee extension stretch (hip in neutral-toes up) 3 minutes (HEP only, prone in clinic) Prone knee extension stretch with rolled up towels above knees 3 minutes 1.5# Recumbent bike Seat 7 for AAROM (forward and back) 5 minutes  Functional Activities (for sit to stand and stairs) Leg Press double leg full extension (3 second hold) to as much flexion as possible 15X 75# slow eccentrics Single leg 37# 10X same hold with flexion and extension Gait with cane (proper cane height and sequencing)  Vaso 5 minutes R knee Medium 34*    PATIENT EDUCATION:  Education details: HEP, see above Person educated: Patient and Child(ren) Education method: Explanation, Demonstration, Tactile cues, Verbal cues, and Handouts Education comprehension: verbalized understanding, returned demonstration, verbal cues required, tactile cues required, and needs further education     HOME EXERCISE PROGRAM: Access Code: [email or web address] URL: [email or web address] Date: 05/22/2022 Prepared by: Vista Mink  Exercises - Supine Quadricep Sets  - 5 x daily - 7 x weekly - 2 sets - 10 reps - 5 second hold - Seated Knee Flexion AAROM  - 3-5 x daily - 7 x weekly - 1 sets - 1 reps - 3 minutes hold - Seated Knee Extension Stretch with Chair  - 3-5 x daily - 7 x weekly - 1 sets - 1 reps - 3-5 minutes hold - Prone Knee Extension with Ankle Weight  - 3 x daily - 7 x weekly - 1 sets - 1 reps - 3-5 minutes hold    ASSESSMENT:   CLINICAL IMPRESSION: Angela Campos did great with the functional  and balance section of her program today.  AROM, quadriceps strength and edema control remain the emphasis of her HEP.  Continue current POC.    OBJECTIVE IMPAIRMENTS Abnormal gait, decreased activity tolerance, decreased endurance, decreased knowledge of condition, decreased mobility, difficulty walking, decreased ROM, decreased strength, increased edema, impaired perceived functional ability, and pain.    ACTIVITY LIMITATIONS bending, standing, squatting, sleeping, stairs, bed mobility, and locomotion level   PARTICIPATION LIMITATIONS: driving, community activity, and occupation   PERSONAL FACTORS  No other factors  are also affecting patient's functional outcome.    REHAB POTENTIAL: Good   CLINICAL DECISION MAKING: Stable/uncomplicated   EVALUATION COMPLEXITY: Low     GOALS: Goals reviewed with patient? Yes   SHORT TERM GOALS: Target date: 06/17/2022  Improve R knee AROM to 5 to 90 degrees Baseline: 13 to 53 degrees Goal status: Partially met 5 - 87 on 06/04/2022   2.  Angela Campos will transition to a cane for walking Baseline: Wheeled walker Goal status: Met  06/04/2022   LONG TERM GOALS: Target date: 07/15/2022    Improve FOTO to 64 Baseline: 36 Goal status: INITIAL   2.  Improve R knee pain to 0-3/10 on the Numeric Pain Rating Scale Baseline: 3-7/10 Goal status: On Going 06/06/2022   3.  Improve R quadriceps strength as assessed by FOTO, walking without an AD and objective measures Baseline:  36 FOTO, walker and 3-/5 MMT Goal status: INITIAL   4.  Improve R knee AROM to 3 to 110 degrees or better Baseline: 13 to 53 degrees Goal status: On Going 06/04/2022   5.  Angela Campos will be independent with her HEP at DC Baseline: Started 05/20/2022 Goal status: INITIAL     PLAN: PT FREQUENCY: 3x/week   PT DURATION: 8 weeks   PLANNED INTERVENTIONS: Therapeutic exercises, Therapeutic activity, Neuromuscular  re-education, Balance training, Gait training, Patient/Family education, Joint  mobilization, Stair training, Electrical stimulation, Cryotherapy, Vasopneumatic device, and Manual therapy   PLAN FOR NEXT SESSION: AROM (flexion and extension), quadriceps strength (increase emphasis on function), edema control, vaso.  Progress balance and functional activities (steps) as time allows (AROM, edema control and strength remain primary concerns).  Check AROM next visit.   Farley Ly, PT, MPT 06/06/2022, 8:48 AM   [facility] [address] [place], [place], [address] Phone: [phone]   Fax:  [phone]

## 2022-06-11 ENCOUNTER — Encounter: Payer: BC Managed Care – PPO | Admitting: Orthopaedic Surgery

## 2022-06-12 ENCOUNTER — Ambulatory Visit (INDEPENDENT_AMBULATORY_CARE_PROVIDER_SITE_OTHER): Payer: BC Managed Care – PPO | Admitting: Rehabilitative and Restorative Service Providers"

## 2022-06-12 ENCOUNTER — Encounter: Payer: Self-pay | Admitting: Rehabilitative and Restorative Service Providers"

## 2022-06-12 DIAGNOSIS — M6281 Muscle weakness (generalized): Secondary | ICD-10-CM | POA: Diagnosis not present

## 2022-06-12 DIAGNOSIS — M25661 Stiffness of right knee, not elsewhere classified: Secondary | ICD-10-CM

## 2022-06-12 DIAGNOSIS — R600 Localized edema: Secondary | ICD-10-CM | POA: Diagnosis not present

## 2022-06-12 DIAGNOSIS — R262 Difficulty in walking, not elsewhere classified: Secondary | ICD-10-CM

## 2022-06-12 DIAGNOSIS — M25561 Pain in right knee: Secondary | ICD-10-CM

## 2022-06-12 NOTE — Therapy (Signed)
[place], [place], [address] Phone: [phone]   Fax:  [phone]  Patient Details  Name: Angela Campos MRN: [id number] Date of Birth: 17-Oct-1971 Referring Provider:  No ref. provider found  Encounter Date: 06/12/2022   OUTPATIENT PHYSICAL THERAPY TREATMENT/PROGRESS NOTE   Patient Name: Angela Campos MRN: [id number] DOB:1971/10/25, 51 y.o., female Today's Date: 06/12/2022  Progress Note Reporting Period 05/20/2022 to 06/12/2022  See note below for Objective Data and Assessment of Progress/Goals.      END OF SESSION:   PT End of Session - 06/12/22 1435     Visit Number 10    Number of Visits 22    Authorization - Number of Visits 22    PT Start Time 7530    PT Stop Time 1520    PT Time Calculation (min) 49 min    Activity Tolerance Patient tolerated treatment well;No increased pain    Behavior During Therapy [facility] for tasks assessed/performed             History reviewed. No pertinent past medical history. Past Surgical History:  Procedure Laterality Date   CESAREAN SECTION     1999 and 2001   DILATION AND CURETTAGE OF UTERUS  2001   KNEE CARTILAGE SURGERY Right    due to meniscus tears   TOOTH EXTRACTION     TOTAL KNEE ARTHROPLASTY Right 05/01/2022   Procedure: RIGHT TOTAL KNEE ARTHROPLASTY;  Surgeon: Mcarthur Rossetti, MD;  Location: [facility];  Service: Orthopedics;  Laterality: Right;   Patient Active Problem List   Diagnosis Date Noted   OA (osteoarthritis) of knee 05/01/2022   Status post total right knee replacement 05/01/2022   Status post arthroscopy of right knee 03/04/2018   Unilateral primary osteoarthritis, right knee 03/04/2018   Acute pain of right knee 01/27/2018     THERAPY DIAG:  Difficulty in walking, not elsewhere classified  Muscle weakness (generalized)  Localized edema  Stiffness of right knee, not elsewhere classified  Right knee pain, unspecified  chronicity  REFERRING PROVIDER: Mcarthur Rossetti, MD   REFERRING DIAG: [phone] (ICD-10-CM) - Status post total right knee replacement    Rationale for Evaluation and Treatment Rehabilitation   ONSET DATE: Surgery 05/01/2022   SUBJECTIVE:    SUBJECTIVE STATEMENT: Angela Campos has been helping her daughter after her surgery so inconsistent HEP compliance the past 3 days.  She is no longer using an assistive device.   PERTINENT HISTORY: Previous R knee surgery (meniscus)   PAIN:  Are you having pain? Yes: NPRS scale: This week 0-4/10 Pain location: R knee Pain description: Achy, can be sharp Aggravating factors: Too much WB or prolonged positions (after 5 hours of sleep) Relieving factors: Ice and pain medications   PRECAUTIONS: None   WEIGHT BEARING RESTRICTIONS No   FALLS:  Has patient fallen in last 6 months? No   LIVING ENVIRONMENT: Lives with: lives with their family Lives in: House/apartment Stairs: Yes: Internal: 15-25 steps; on left going up Has following equipment at home: [name] - 2 wheeled   OCCUPATION: Engineer, maintenance (IT) full-time   PLOF: Independent   PATIENT GOALS: Return to normal function.  Walking and work.     OBJECTIVE:    DIAGNOSTIC FINDINGS: IMPRESSION: Status post total right knee arthroplasty without evidence of hardware failure.  PATIENT SURVEYS:  FOTO 06/12/2022: 52 (Goal 64, was 36); FOTO 05/20/2022: 36 (Goal 64 in 16 visits)       SENSATION: No complaints of peripheral pains or  paresthesias   EDEMA:  Noted but not measured   LOWER EXTREMITY ROM:   Active ROM Right 05/20/22 eval Left 05/20/22 eval Right 05/22/22 Right 05/29/22 Right 06/04/2022 Right 06/12/2022  Hip flexion          Hip extension          Hip abduction          Hip adduction          Hip internal rotation          Hip external rotation          Knee flexion 53 133 65 78 87 97  Knee extension '13 1 8 6 5 3  ' Ankle dorsiflexion          Ankle plantarflexion          Ankle  inversion          Ankle eversion           (Blank rows = not tested)   LOWER EXTREMITY MMT:   MMT Right eval Left eval  Hip flexion      Hip extension      Hip abduction      Hip adduction      Hip internal rotation      Hip external rotation      Knee flexion      Knee extension 3-/5 5/5  Ankle dorsiflexion      Ankle plantarflexion      Ankle inversion      Ankle eversion       (Blank rows = not tested)   GAIT: Distance walked: 100+ feet Assistive device utilized: Environmental consultant - 2 wheeled Level of assistance: Modified independence Comments: Step-to gait       TODAY'S TREATMENT: 06/12/2022 Quadriceps sets with heel prop 2 sets of 10 for 5 seconds Tailgate knee flexion 1 minute Knee flexion AAROM (L pushes R into flexion) 10X 10 seconds Prone knee extension stretch with rolled up towels above knees 3 minutes 1.5# Recumbent bike Seat 7 for AAROM (forward and back) 8 minutes Weight-shift stretch into flexion and extension off 8 inch step 5X each  Functional Activities (for sit to stand and stairs) Leg Press double leg full extension (3 second hold) to as much flexion as possible 15X 100# slow eccentrics Single leg 50# 10X same hold with flexion and extension   Vaso 10 minutes R knee High 34*   06/06/2022 Quadriceps sets with heel prop 2 sets of 10 for 5 seconds Tailgate knee flexion 1 minute Knee flexion AAROM (L pushes R into flexion) 10X 10 seconds *(HEP only) Seated knee extension stretch (hip in neutral-toes up) 3 minutes (HEP only, prone in clinic) Prone knee extension stretch with rolled up towels above knees 3 minutes 1.5# Recumbent bike Seat 7 for AAROM (forward and back) 8 minutes Weight-shift stretch into flexion and extension off 8 inch step 5X each  Functional Activities (for sit to stand and stairs) Leg Press double leg full extension (3 second hold) to as much flexion as possible 15X 87# slow eccentrics Single leg 37# 10X same hold with flexion and  extension Step-up and over 4 and 6 inch step 10X each slow eccentrics Lateral step-down 10X on  2 inch step  Neuromuscular activities: Tandem baalnce eyes open; head turning and eyes closed 1X each 20 seconds and single leg stance 2X 10 seconds each  Vaso 10 minutes R knee Medium 34*   06/05/2022 (HEP only) Quadriceps sets with heel prop  2 sets of 10 for 5 seconds Tailgate knee flexion 1 minute Knee flexion AAROM (L pushes R into flexion) 10X 10 seconds (HEP only) Seated knee extension stretch (hip in neutral-toes up) 3 minutes (HEP only, prone in clinic) Prone knee extension stretch with rolled up towels above knees 3 minutes 1.5# Recumbent bike Seat 7 for AAROM (forward and back) 8 minutes  Functional Activities (for sit to stand and stairs) -Leg Press double leg full extension (3 second hold) to as much flexion as possible 15X 87# slow eccentrics -Single leg 37# 10X same hold with flexion and extension Step-up and over 2 and 4 inch step 10X each slow eccentrics Lateral step-down 10X each 2 and 4 inch step  Neuromuscular activities: Tandem baalnce eyes open; head turning and eyes closed 2X each 20 seconds and single leg stance 3X 10 seconds each  Vaso 10 minutes R knee Medium 34*     PATIENT EDUCATION:  Education details: HEP, see above Person educated: Patient and Child(ren) Education method: Explanation, Demonstration, Tactile cues, Verbal cues, and Handouts Education comprehension: verbalized understanding, returned demonstration, verbal cues required, tactile cues required, and needs further education     HOME EXERCISE PROGRAM: Access Code: [email or web address] URL: [email or web address] Date: 05/22/2022 Prepared by: Vista Mink  Exercises - Supine Quadricep Sets  - 5 x daily - 7 x weekly - 2 sets - 10 reps - 5 second hold - Seated Knee Flexion AAROM  - 3-5 x daily - 7 x weekly - 1 sets - 1 reps - 3 minutes hold - Seated Knee Extension Stretch with Chair  - 3-5  x daily - 7 x weekly - 1 sets - 1 reps - 3-5 minutes hold - Prone Knee Extension with Ankle Weight  - 3 x daily - 7 x weekly - 1 sets - 1 reps - 3-5 minutes hold    ASSESSMENT:   CLINICAL IMPRESSION:  Angela Campos is now lacking only 3 degrees of R knee extension AROM.  Flexion is 97 degrees.  She is walking without an assistive device and was encouraged to keep using an AD with prolonged WB.  Knee AROM, quadriceps strength and edema control remain the emphasis of her HEP.  Balance, gait and functional activities will be more of a focus in the clinic.  I expect she will meet LTGs in 3-4 weeks.    OBJECTIVE IMPAIRMENTS Abnormal gait, decreased activity tolerance, decreased endurance, decreased knowledge of condition, decreased mobility, difficulty walking, decreased ROM, decreased strength, increased edema, impaired perceived functional ability, and pain.    ACTIVITY LIMITATIONS bending, standing, squatting, sleeping, stairs, bed mobility, and locomotion level   PARTICIPATION LIMITATIONS: driving, community activity, and occupation   PERSONAL FACTORS  No other factors  are also affecting patient's functional outcome.    REHAB POTENTIAL: Good   CLINICAL DECISION MAKING: Stable/uncomplicated   EVALUATION COMPLEXITY: Low     GOALS: Goals reviewed with patient? Yes   SHORT TERM GOALS: Target date: 06/17/2022  Improve R knee AROM to 5 to 90 degrees Baseline: 13 to 53 degrees Goal status: Met 3 - 97 on 06/12/2022   2.  Angela Campos will transition to a cane for walking Baseline: Wheeled walker Goal status: Met  06/04/2022   LONG TERM GOALS: Target date: 07/15/2022    Improve FOTO to 64 Baseline: 36 Goal status: On Going 52 on 06/12/2022   2.  Improve R knee pain to 0-3/10 on the Numeric Pain Rating Scale Baseline: 3-7/10 Goal status: On Going 06/12/2022  3.  Improve R quadriceps strength as assessed by FOTO, walking without an AD and objective measures Baseline:  36 FOTO, walker and 3-/5  MMT Goal status: On Going 06/12/2022   4.  Improve R knee AROM to 3 to 110 degrees or better Baseline: 13 to 53 degrees Goal status: Partially Met 06/12/2022   5.  Angela Campos will be independent with her HEP at DC Baseline: Started 05/20/2022 Goal status: On Going 06/12/2022     PLAN: PT FREQUENCY: 1-2x/week   PT DURATION: 4 weeks   PLANNED INTERVENTIONS: Therapeutic exercises, Therapeutic activity, Neuromuscular re-education, Balance training, Gait training, Patient/Family education, Joint mobilization, Stair training, Electrical stimulation, Cryotherapy, Vasopneumatic device, and Manual therapy   PLAN FOR NEXT SESSION: AROM (flexion and extension), quadriceps strength (increase emphasis on function), edema control, vaso.  Clinic emphasis on progressing balance and functional activities (steps).  Farley Ly, PT, MPT 06/12/2022, 4:25 PM   [facility] [address] [place], [place], [address] Phone: [phone]   Fax:  [phone]

## 2022-06-13 ENCOUNTER — Encounter: Payer: BC Managed Care – PPO | Admitting: Rehabilitative and Restorative Service Providers"

## 2022-06-16 ENCOUNTER — Ambulatory Visit (INDEPENDENT_AMBULATORY_CARE_PROVIDER_SITE_OTHER): Payer: BC Managed Care – PPO | Admitting: Orthopaedic Surgery

## 2022-06-16 ENCOUNTER — Encounter: Payer: Self-pay | Admitting: Orthopaedic Surgery

## 2022-06-16 ENCOUNTER — Encounter: Payer: Self-pay | Admitting: Rehabilitative and Restorative Service Providers"

## 2022-06-16 ENCOUNTER — Ambulatory Visit (INDEPENDENT_AMBULATORY_CARE_PROVIDER_SITE_OTHER): Payer: BC Managed Care – PPO | Admitting: Rehabilitative and Restorative Service Providers"

## 2022-06-16 DIAGNOSIS — Z96651 Presence of right artificial knee joint: Secondary | ICD-10-CM

## 2022-06-16 DIAGNOSIS — M6281 Muscle weakness (generalized): Secondary | ICD-10-CM

## 2022-06-16 DIAGNOSIS — R262 Difficulty in walking, not elsewhere classified: Secondary | ICD-10-CM | POA: Diagnosis not present

## 2022-06-16 DIAGNOSIS — M25661 Stiffness of right knee, not elsewhere classified: Secondary | ICD-10-CM | POA: Diagnosis not present

## 2022-06-16 DIAGNOSIS — M25561 Pain in right knee: Secondary | ICD-10-CM

## 2022-06-16 DIAGNOSIS — R600 Localized edema: Secondary | ICD-10-CM

## 2022-06-16 MED ORDER — OXYCODONE HCL 10 MG PO TABS: 10.0000 mg | ORAL_TABLET | Freq: Three times a day (TID) | ORAL | 0 refills | Status: AC | PRN

## 2022-06-16 MED ORDER — GABAPENTIN 100 MG PO CAPS: 100.0000 mg | ORAL_CAPSULE | Freq: Three times a day (TID) | ORAL | 1 refills | Status: AC

## 2022-06-16 NOTE — Progress Notes (Signed)
The patient is now almost 7 weeks status post a right total knee arthroplasty.  She is a young 51 year old female who had severe arthritis in her right knee years after an ACL reconstruction.  She is working well through physical therapy.  She does have a constant daily pain with that need to be expected given her young age.  Her extension is almost full of the right knee today.  There is swelling to be expected.  I can flex her to about 95 degrees and just beyond this.  She is improving about 10 degrees a week.  Thus far she does not look like she needs a manipulation of that knee.  I gave her reassurance that she is on track to have a good outcome and she should continue to push hard.  I have encouraged her to get back on Neurontin to take as needed and will refill her oxycodone since she is using this sparingly.  She can drop from my standpoint as long as she does not have a narcotics in her system.  I would like to see her back in 4 weeks to see how she is doing overall.  I would like a standing AP and lateral of the right knee at that visit.

## 2022-06-16 NOTE — Therapy (Signed)
Patient Details  Name: Angela Campos MRN: [id number] Date of Birth: 1971-02-16 Referring Provider:  No ref. provider found  Encounter Date: 06/16/2022   OUTPATIENT PHYSICAL THERAPY TREATMENT   Patient Name: Angela Campos MRN: [id number] DOB:1971/02/11, 51 y.o., female Today's Date: 06/16/2022     END OF SESSION:   PT End of Session - 06/16/22 1109     Visit Number 11    Number of Visits 22    Date for PT Re-Evaluation 07/15/22    Authorization - Number of Visits 22    Progress Note Due on Visit 20    PT Start Time 1104    PT Stop Time 1157    PT Time Calculation (min) 53 min    Activity Tolerance Patient tolerated treatment well    Behavior During Therapy [facility] for tasks assessed/performed              History reviewed. No pertinent past medical history. Past Surgical History:  Procedure Laterality Date   CESAREAN SECTION     1999 and 2001   DILATION AND CURETTAGE OF UTERUS  2001   KNEE CARTILAGE SURGERY Right    due to meniscus tears   TOOTH EXTRACTION     TOTAL KNEE ARTHROPLASTY Right 05/01/2022   Procedure: RIGHT TOTAL KNEE ARTHROPLASTY;  Surgeon: Mcarthur Rossetti, MD;  Location: [place];  Service: Orthopedics;  Laterality: Right;   Patient Active Problem List   Diagnosis Date Noted   OA (osteoarthritis) of knee 05/01/2022   Status post total right knee replacement 05/01/2022   Status post arthroscopy of right knee 03/04/2018   Unilateral primary osteoarthritis, right knee 03/04/2018   Acute pain of right knee 01/27/2018     THERAPY DIAG:  Difficulty in walking, not elsewhere classified  Muscle weakness (generalized)  Localized edema  Stiffness of right knee, not elsewhere classified  Right knee pain, unspecified chronicity  REFERRING PROVIDER: Mcarthur Rossetti, MD   REFERRING DIAG: [phone] (ICD-10-CM) - Status post total right knee replacement    Rationale for Evaluation and Treatment Rehabilitation   ONSET DATE: Surgery 05/01/2022    SUBJECTIVE:    SUBJECTIVE STATEMENT: Pt indicated having continued similar overall complaints at night and various times during the day.  Pt indicated hard to get through without meds at night.  Saw MD and reported good overall report.    PERTINENT HISTORY: Previous Rt knee surgery (meniscus)   PAIN:  NPRS scale: 0/10 at rest, up to 7/10 at night  Pain location: Rt knee Pain description: Achy, can be sharp Aggravating factors: nighttime, lateral movements Relieving factors: Ice and pain medications   PRECAUTIONS: None   WEIGHT BEARING RESTRICTIONS No   FALLS:  Has patient fallen in last 6 months? No   LIVING ENVIRONMENT: Lives with: lives with their family Lives in: House/apartment Stairs: Yes: Internal: 15-25 steps; on left going up Has following equipment at home: [name] - 2 wheeled   OCCUPATION: Engineer, maintenance (IT) full-time   PLOF: Independent   PATIENT GOALS: Return to normal function.  Walking and work.     OBJECTIVE:    DIAGNOSTIC FINDINGS: IMPRESSION: Status post total right knee arthroplasty without evidence of hardware failure.  PATIENT SURVEYS:  FOTO 06/12/2022: 52 (Goal 64, was 36); FOTO 05/20/2022: 36 (Goal 64 in 16 visits)       SENSATION: No complaints of peripheral pains or paresthesias   EDEMA:  Noted but not measured   LOWER EXTREMITY ROM:   Active ROM Right 05/20/22 eval  Left 05/20/22 eval Right 05/22/22 Right 05/29/22 Right 06/04/2022 Right 06/12/2022  Hip flexion          Hip extension          Hip abduction          Hip adduction          Hip internal rotation          Hip external rotation          Knee flexion 53 133 65 78 87 97  Knee extension '13 1 8 6 5 3  ' Ankle dorsiflexion          Ankle plantarflexion          Ankle inversion          Ankle eversion           (Blank rows = not tested)   LOWER EXTREMITY MMT:   MMT Right eval Left eval  Hip flexion      Hip extension      Hip abduction      Hip adduction      Hip internal rotation       Hip external rotation      Knee flexion      Knee extension 3-/5 5/5  Ankle dorsiflexion      Ankle plantarflexion      Ankle inversion      Ankle eversion       (Blank rows = not tested)   GAIT: Distance walked: 100+ feet Assistive device utilized: Environmental consultant - 2 wheeled Level of assistance: Modified independence Comments: Step-to gait       TODAY'S TREATMENT: 06/16/2022: Therex:  UBE UE/LE lvl 1.0 full revolutions 10 mins , seat 10  Seated Rt quad set 5 sec hold x 5  Seated SLR c quad set 3 x 5  Heels off step gastroc stretch bilateral 30 sec x 3   Verbal review of existing HEP (quad sets supine, overpressure stretch)  TherActivity (to improve stair navigation, squat functional movements)  Fwd step up 4 inch WB on Rt x 10 (no UE assist)  Lateral step up 4 inch WB on Rt x 10 (no UE assist)  Single leg press 56 lbs 2 x 15   Manual  Seated Rt knee flexion/distraction/IR for flexion mobility gains/symptom relief c movement   Vasopneumatic  Rt knee high 34 degrees in elevation 10 mins    06/12/2022 Quadriceps sets with heel prop 2 sets of 10 for 5 seconds Tailgate knee flexion 1 minute Knee flexion AAROM (L pushes R into flexion) 10X 10 seconds Prone knee extension stretch with rolled up towels above knees 3 minutes 1.5# Recumbent bike Seat 7 for AAROM (forward and back) 8 minutes Weight-shift stretch into flexion and extension off 8 inch step 5X each  Functional Activities (for sit to stand and stairs) Leg Press double leg full extension (3 second hold) to as much flexion as possible 15X 100# slow eccentrics Single leg 50# 10X same hold with flexion and extension   Vaso 10 minutes R knee High 34*   06/06/2022 Quadriceps sets with heel prop 2 sets of 10 for 5 seconds Tailgate knee flexion 1 minute Knee flexion AAROM (L pushes R into flexion) 10X 10 seconds *(HEP only) Seated knee extension stretch (hip in neutral-toes up) 3 minutes (HEP only, prone in  clinic) Prone knee extension stretch with rolled up towels above knees 3 minutes 1.5# Recumbent bike Seat 7 for AAROM (forward and back) 8 minutes  Weight-shift stretch into flexion and extension off 8 inch step 5X each  Functional Activities (for sit to stand and stairs) Leg Press double leg full extension (3 second hold) to as much flexion as possible 15X 87# slow eccentrics Single leg 37# 10X same hold with flexion and extension Step-up and over 4 and 6 inch step 10X each slow eccentrics Lateral step-down 10X on  2 inch step  Neuromuscular activities: Tandem baalnce eyes open; head turning and eyes closed 1X each 20 seconds and single leg stance 2X 10 seconds each  Vaso 10 minutes R knee Medium 34*     PATIENT EDUCATION:  Education details: HEP, see above Person educated: Patient and Child(ren) Education method: Explanation, Demonstration, Tactile cues, Verbal cues, and Handouts Education comprehension: verbalized understanding, returned demonstration, verbal cues required, tactile cues required, and needs further education     HOME EXERCISE PROGRAM: Access Code: [email or web address] URL: [email or web address] Date: 05/22/2022 Prepared by: Vista Mink  Exercises - Supine Quadricep Sets  - 5 x daily - 7 x weekly - 2 sets - 10 reps - 5 second hold - Seated Knee Flexion AAROM  - 3-5 x daily - 7 x weekly - 1 sets - 1 reps - 3 minutes hold - Seated Knee Extension Stretch with Chair  - 3-5 x daily - 7 x weekly - 1 sets - 1 reps - 3-5 minutes hold - Prone Knee Extension with Ankle Weight  - 3 x daily - 7 x weekly - 1 sets - 1 reps - 3-5 minutes hold    ASSESSMENT:   CLINICAL IMPRESSION:  To help improve navigation of stairs into house, progressive loading in WB c focus on no UE help to replicate home environment.  Fair control on 4 inch step noted today.  Continued progressive strengthening and improvement in balance control to help improve functional movement patterns.      OBJECTIVE IMPAIRMENTS Abnormal gait, decreased activity tolerance, decreased endurance, decreased knowledge of condition, decreased mobility, difficulty walking, decreased ROM, decreased strength, increased edema, impaired perceived functional ability, and pain.    ACTIVITY LIMITATIONS bending, standing, squatting, sleeping, stairs, bed mobility, and locomotion level   PARTICIPATION LIMITATIONS: driving, community activity, and occupation   PERSONAL FACTORS  No other factors  are also affecting patient's functional outcome.    REHAB POTENTIAL: Good   CLINICAL DECISION MAKING: Stable/uncomplicated   EVALUATION COMPLEXITY: Low     GOALS: Goals reviewed with patient? Yes   SHORT TERM GOALS: Target date: 06/17/2022  Improve Rt knee AROM to 5 to 90 degrees Baseline: 13 to 53 degrees Goal status: Met 3 - 97 on 06/12/2022   2.  Angela Campos will transition to a cane for walking Baseline: Wheeled walker Goal status: Met  06/04/2022   LONG TERM GOALS: Target date: 07/15/2022    Improve FOTO to 64 Baseline: 36 Goal status: On Going 52 on 06/12/2022   2.  Improve Rt knee pain to 0-3/10 on the Numeric Pain Rating Scale Baseline: 3-7/10 Goal status: On Going 06/12/2022   3.  Improve Rt quadriceps strength as assessed by FOTO, walking without an AD and objective measures Baseline:  36 FOTO, walker and 3-/5 MMT Goal status: On Going 06/12/2022   4.  Improve Rt knee AROM to 3 to 110 degrees or better Baseline: 13 to 53 degrees Goal status: Partially Met 06/12/2022   5.  Angela Campos will be independent with her HEP at DC Baseline: Started 05/20/2022 Goal status: On Going 06/12/2022     PLAN:  PT FREQUENCY: 1-2x/week   PT DURATION: 4 weeks   PLANNED INTERVENTIONS: Therapeutic exercises, Therapeutic activity, Neuromuscular re-education, Balance training, Gait training, Patient/Family education, Joint mobilization, Stair training, Electrical stimulation, Cryotherapy, Vasopneumatic device, and Manual  therapy   PLAN FOR NEXT SESSION: Focus on no hand rail assisted stair progression as tolerated.    Scot Jun, PT, DPT, OCS, ATC 06/16/22  11:44 AM     [facility] [address] [place], [place], [address] Phone: [phone]   Fax:  [phone]

## 2022-06-19 ENCOUNTER — Encounter: Payer: Self-pay | Admitting: Rehabilitative and Restorative Service Providers"

## 2022-06-19 ENCOUNTER — Encounter: Payer: BC Managed Care – PPO | Admitting: Rehabilitative and Restorative Service Providers"

## 2022-06-19 ENCOUNTER — Ambulatory Visit (INDEPENDENT_AMBULATORY_CARE_PROVIDER_SITE_OTHER): Payer: BC Managed Care – PPO | Admitting: Rehabilitative and Restorative Service Providers"

## 2022-06-19 DIAGNOSIS — R600 Localized edema: Secondary | ICD-10-CM | POA: Diagnosis not present

## 2022-06-19 DIAGNOSIS — M25661 Stiffness of right knee, not elsewhere classified: Secondary | ICD-10-CM

## 2022-06-19 DIAGNOSIS — M6281 Muscle weakness (generalized): Secondary | ICD-10-CM | POA: Diagnosis not present

## 2022-06-19 DIAGNOSIS — R262 Difficulty in walking, not elsewhere classified: Secondary | ICD-10-CM | POA: Diagnosis not present

## 2022-06-19 DIAGNOSIS — M25561 Pain in right knee: Secondary | ICD-10-CM

## 2022-06-19 NOTE — Therapy (Signed)
Patient Details  Name: Angela Campos MRN: [id number] Date of Birth: Apr 23, 1971 Referring Provider:  No ref. provider found  Encounter Date: 06/19/2022   OUTPATIENT PHYSICAL THERAPY TREATMENT   Patient Name: Angela Campos MRN: [id number] DOB:May 17, 1971, 51 y.o., female Today's Date: 06/19/2022     END OF SESSION:   PT End of Session - 06/19/22 1439     Visit Number 12    Number of Visits 22    Date for PT Re-Evaluation 07/15/22    Authorization - Number of Visits 22    Progress Note Due on Visit 20    PT Start Time 1432    PT Stop Time 1525    PT Time Calculation (min) 53 min    Activity Tolerance Patient tolerated treatment well    Behavior During Therapy [facility] for tasks assessed/performed             History reviewed. No pertinent past medical history. Past Surgical History:  Procedure Laterality Date   CESAREAN SECTION     1999 and 2001   DILATION AND CURETTAGE OF UTERUS  2001   KNEE CARTILAGE SURGERY Right    due to meniscus tears   TOOTH EXTRACTION     TOTAL KNEE ARTHROPLASTY Right 05/01/2022   Procedure: RIGHT TOTAL KNEE ARTHROPLASTY;  Surgeon: Mcarthur Rossetti, MD;  Location: [place];  Service: Orthopedics;  Laterality: Right;   Patient Active Problem List   Diagnosis Date Noted   OA (osteoarthritis) of knee 05/01/2022   Status post total right knee replacement 05/01/2022   Status post arthroscopy of right knee 03/04/2018   Unilateral primary osteoarthritis, right knee 03/04/2018   Acute pain of right knee 01/27/2018     THERAPY DIAG:  Difficulty in walking, not elsewhere classified  Muscle weakness (generalized)  Localized edema  Stiffness of right knee, not elsewhere classified  Right knee pain, unspecified chronicity  REFERRING PROVIDER: Mcarthur Rossetti, MD   REFERRING DIAG: [phone] (ICD-10-CM) - Status post total right knee replacement    Rationale for Evaluation and Treatment Rehabilitation   ONSET DATE: Surgery 05/01/2022    SUBJECTIVE:    SUBJECTIVE STATEMENT: [name] reports she is now able to complete a full revolution on her bike at home.  She reports Dr. Ninfa Linden is pleased with her early progress.   PERTINENT HISTORY: Previous Rt knee surgery (meniscus)   PAIN:  NPRS scale: 0/10 at best, up to 5/10 at worst this week Pain location: Rt knee Pain description: Achy, can be sharp Aggravating factors: nighttime, lateral movements Relieving factors: Ice and pain medications   PRECAUTIONS: None   WEIGHT BEARING RESTRICTIONS No   FALLS:  Has patient fallen in last 6 months? No   LIVING ENVIRONMENT: Lives with: lives with their family Lives in: House/apartment Stairs: Yes: Internal: 15-25 steps; on left going up Has following equipment at home: [name] - 2 wheeled   OCCUPATION: Engineer, maintenance (IT) full-time   PLOF: Independent   PATIENT GOALS: Return to normal function.  Walking and work.     OBJECTIVE:    DIAGNOSTIC FINDINGS: IMPRESSION: Status post total right knee arthroplasty without evidence of hardware failure.  PATIENT SURVEYS:  FOTO 06/12/2022: 52 (Goal 64, was 36); FOTO 05/20/2022: 36 (Goal 64 in 16 visits)       SENSATION: No complaints of peripheral pains or paresthesias   EDEMA:  Noted but not measured   LOWER EXTREMITY ROM:   Active ROM Right 05/20/22 eval Left 05/20/22 eval Right 05/22/22 Right 05/29/22 Right  06/04/2022 Right 06/12/2022  Hip flexion          Hip extension          Hip abduction          Hip adduction          Hip internal rotation          Hip external rotation          Knee flexion 53 133 65 78 87 97  Knee extension _0 Ankle dorsiflexion          Ankle plantarflexion          Ankle inversion          Ankle eversion           (Blank rows = not tested)   LOWER EXTREMITY MMT:   MMT Right eval Left eval  Hip flexion      Hip extension      Hip abduction      Hip adduction      Hip internal rotation      Hip external rotation      Knee flexion       Knee extension 3-/5 5/5  Ankle dorsiflexion      Ankle plantarflexion      Ankle inversion      Ankle eversion       (Blank rows = not tested)   GAIT: Distance walked: 100+ feet Assistive device utilized: Environmental consultant - 2 wheeled Level of assistance: Modified independence Comments: Step-to gait       TODAY'S TREATMENT: 06/19/2022 Quadriceps sets with heel prop 10X for 5 seconds Tailgate knee flexion 1 minute Knee flexion AAROM (L pushes R into flexion) 10X 10 seconds Prone knee extension stretch with rolled up towels above knees 3 minutes 1.5# Recumbent bike Seat 7 for AAROM (forward and back) 8 minutes  Functional Activities (for sit to stand and stairs) Leg Press double leg full extension (3 second hold) to as much flexion as possible 15X 112# slow eccentrics Single leg 56# 10X same hold with flexion and extension Step-up and over with UE support 8 inch step and up and over no hands 6 inch step 10X each slow eccentrics Lateral step-down off 4 inch step with UE support 2 sets of 10 slow eccentrics  Neuromuscular re-education: single leg balance 5X 10 seconds B  Vaso 10 minutes R knee High 34*   06/16/2022 Therex:  UBE UE/LE lvl 1.0 full revolutions 10 mins , seat 10  Seated Rt quad set 5 sec hold x 5  Seated SLR c quad set 3 x 5  Heels off step gastroc stretch bilateral 30 sec x 3   Verbal review of existing HEP (quad sets supine, overpressure stretch)  TherActivity (to improve stair navigation, squat functional movements)  Fwd step up 4 inch WB on Rt x 10 (no UE assist)  Lateral step up 4 inch WB on Rt x 10 (no UE assist)  Single leg press 56 lbs 2 x 15   Manual  Seated Rt knee flexion/distraction/IR for flexion mobility gains/symptom relief c movement   Vasopneumatic  Rt knee high 34 degrees in elevation 10 mins    06/12/2022 Quadriceps sets with heel prop 2 sets of 10 for 5 seconds Tailgate knee flexion 1 minute Knee flexion AAROM (L pushes R into flexion)  10X 10 seconds Prone knee extension stretch with rolled up towels above knees 3 minutes 1.5# Recumbent bike Seat 7 for  AAROM (forward and back) 8 minutes Weight-shift stretch into flexion and extension off 8 inch step 5X each  Functional Activities (for sit to stand and stairs) Leg Press double leg full extension (3 second hold) to as much flexion as possible 15X 100# slow eccentrics Single leg 50# 10X same hold with flexion and extension   Vaso 10 minutes R knee High 34*     PATIENT EDUCATION:  Education details: HEP, see above Person educated: Patient and Child(ren) Education method: Explanation, Demonstration, Tactile cues, Verbal cues, and Handouts Education comprehension: verbalized understanding, returned demonstration, verbal cues required, tactile cues required, and needs further education     HOME EXERCISE PROGRAM: Access Code: [email or web address] URL: [email or web address] Date: 05/22/2022 Prepared by: Vista Mink  Exercises - Supine Quadricep Sets  - 5 x daily - 7 x weekly - 2 sets - 10 reps - 5 second hold - Seated Knee Flexion AAROM  - 3-5 x daily - 7 x weekly - 1 sets - 1 reps - 3 minutes hold - Seated Knee Extension Stretch with Chair  - 3-5 x daily - 7 x weekly - 1 sets - 1 reps - 3-5 minutes hold - Prone Knee Extension with Ankle Weight  - 3 x daily - 7 x weekly - 1 sets - 1 reps - 3-5 minutes hold    ASSESSMENT:   CLINICAL IMPRESSION:  Aiya reports HEP compliance has been a bit more difficult with work and home responsibilities, although she was able to complete a full revolution on the bike at home and in the office demonstrating improved R knee flexion AROM.  AROM, quadriceps strength and function remain high priorities with her home and clinic program.    OBJECTIVE IMPAIRMENTS Abnormal gait, decreased activity tolerance, decreased endurance, decreased knowledge of condition, decreased mobility, difficulty walking, decreased ROM, decreased strength,  increased edema, impaired perceived functional ability, and pain.    ACTIVITY LIMITATIONS bending, standing, squatting, sleeping, stairs, bed mobility, and locomotion level   PARTICIPATION LIMITATIONS: driving, community activity, and occupation   PERSONAL FACTORS  No other factors  are also affecting patient's functional outcome.    REHAB POTENTIAL: Good   CLINICAL DECISION MAKING: Stable/uncomplicated   EVALUATION COMPLEXITY: Low     GOALS: Goals reviewed with patient? Yes   SHORT TERM GOALS: Target date: 06/17/2022  Improve Rt knee AROM to 5 to 90 degrees Baseline: 13 to 53 degrees Goal status: Met 3 - 97 on 06/12/2022   2.  Shery will transition to a cane for walking Baseline: Wheeled walker Goal status: Met  06/04/2022   LONG TERM GOALS: Target date: 07/15/2022    Improve FOTO to 64 Baseline: 36 Goal status: On Going 52 on 06/12/2022   2.  Improve Rt knee pain to 0-3/10 on the Numeric Pain Rating Scale Baseline: 3-7/10 Goal status: On Going 06/19/2022   3.  Improve Rt quadriceps strength as assessed by FOTO, walking without an AD and objective measures Baseline:  36 FOTO, walker and 3-/5 MMT Goal status: On Going 06/19/2022   4.  Improve Rt knee AROM to 3 to 110 degrees or better Baseline: 13 to 53 degrees Goal status: Partially Met 06/12/2022   5.  Lamyia will be independent with her HEP at DC Baseline: Started 05/20/2022 Goal status: On Going 06/19/2022     PLAN: PT FREQUENCY: 1-2x/week   PT DURATION: 4 weeks   PLANNED INTERVENTIONS: Therapeutic exercises, Therapeutic activity, Neuromuscular re-education, Balance training, Gait training, Patient/Family education, Joint mobilization, Stair training, Dealer  stimulation, Cryotherapy, Vasopneumatic device, and Manual therapy   PLAN FOR NEXT SESSION: Return full AROM, functional strengthening (stairs without a hand rail) and balance as needed.  Farley Ly PT, MPT 06/19/22  3:12 PM     [facility] [address] [place], [place], [address] Phone: [phone]   Fax:  [phone]

## 2022-06-23 ENCOUNTER — Encounter: Payer: Self-pay | Admitting: Rehabilitative and Restorative Service Providers"

## 2022-06-23 ENCOUNTER — Ambulatory Visit (INDEPENDENT_AMBULATORY_CARE_PROVIDER_SITE_OTHER): Payer: BC Managed Care – PPO | Admitting: Rehabilitative and Restorative Service Providers"

## 2022-06-23 DIAGNOSIS — M25661 Stiffness of right knee, not elsewhere classified: Secondary | ICD-10-CM

## 2022-06-23 DIAGNOSIS — R600 Localized edema: Secondary | ICD-10-CM | POA: Diagnosis not present

## 2022-06-23 DIAGNOSIS — M6281 Muscle weakness (generalized): Secondary | ICD-10-CM | POA: Diagnosis not present

## 2022-06-23 DIAGNOSIS — R262 Difficulty in walking, not elsewhere classified: Secondary | ICD-10-CM

## 2022-06-23 DIAGNOSIS — M25561 Pain in right knee: Secondary | ICD-10-CM

## 2022-06-23 NOTE — Therapy (Signed)
Patient Details  Name: Angela Campos MRN: [id number] Date of Birth: Mar 12, 1971 Referring Provider:  No ref. provider found  Encounter Date: 06/23/2022   OUTPATIENT PHYSICAL THERAPY TREATMENT   Patient Name: Angela Campos MRN: [id number] DOB:[birth year], 51 y.o., female Today's Date: 06/23/2022     END OF SESSION:   PT End of Session - 06/23/22 0901     Visit Number 13    Number of Visits 22    Date for PT Re-Evaluation 07/15/22    Authorization - Number of Visits 22    Progress Note Due on Visit 20    PT Start Time 0846    PT Stop Time 0936    PT Time Calculation (min) 50 min    Activity Tolerance Patient tolerated treatment well    Behavior During Therapy Angela Campos for tasks assessed/performed              History reviewed. No pertinent past medical history. Past Surgical History:  Procedure Laterality Date   CESAREAN SECTION     1999 and 2001   DILATION AND CURETTAGE OF UTERUS  2001   KNEE CARTILAGE SURGERY Right    due to meniscus tears   TOOTH EXTRACTION     TOTAL KNEE ARTHROPLASTY Right 05/01/2022   Procedure: RIGHT TOTAL KNEE ARTHROPLASTY;  Surgeon: Mcarthur Rossetti, MD;  Location: [facility];  Service: Orthopedics;  Laterality: Right;   Patient Active Problem List   Diagnosis Date Noted   OA (osteoarthritis) of knee 05/01/2022   Status post total right knee replacement 05/01/2022   Status post arthroscopy of right knee 03/04/2018   Unilateral primary osteoarthritis, right knee 03/04/2018   Acute pain of right knee 01/27/2018     THERAPY DIAG:  Difficulty in walking, not elsewhere classified  Muscle weakness (generalized)  Localized edema  Stiffness of right knee, not elsewhere classified  Right knee pain, unspecified chronicity  REFERRING PROVIDER: Mcarthur Rossetti, MD   REFERRING DIAG: [id number] (ICD-10-CM) - Status post total right knee replacement    Rationale for Evaluation and Treatment Rehabilitation   ONSET DATE: Surgery 05/01/2022    SUBJECTIVE:    SUBJECTIVE STATEMENT: Pt indicated having difficulty with symptoms in prolonged walking in stores on hard surfaces.  Reported doing that walking independently.     PERTINENT HISTORY: Previous Rt knee surgery (meniscus)   PAIN:  NPRS scale: 3/10 upon arrival  Pain location: Rt knee Pain description: Achy, can be sharp Aggravating factors: nighttime, lateral movements Relieving factors: Ice and pain medications   PRECAUTIONS: None   Campos BEARING RESTRICTIONS No   FALLS:  Has patient fallen in last 6 months? No   LIVING ENVIRONMENT: Lives with: lives with their family Lives in: House/apartment Stairs: Yes: Internal: 15-25 steps; on left going up Has following equipment at home: [name] - 2 wheeled   OCCUPATION: Engineer, maintenance (IT) full-time   PLOF: Independent   PATIENT GOALS: Return to normal function.  Walking and work.     OBJECTIVE:    DIAGNOSTIC FINDINGS: IMPRESSION: Status post total right knee arthroplasty without evidence of hardware failure.  PATIENT SURVEYS:  FOTO 06/12/2022: 52 (Goal 64, was 36); FOTO 05/20/2022: 36 (Goal 64 in 16 visits)       SENSATION: No complaints of peripheral pains or paresthesias   EDEMA:  Noted but not measured   LOWER EXTREMITY ROM:   Active ROM Right 05/20/22 eval Left 05/20/22 eval Right 05/22/22 Right 05/29/22 Right 06/04/2022 Right 06/12/2022  Hip flexion  Hip extension          Hip abduction          Hip adduction          Hip internal rotation          Hip external rotation          Knee flexion 53 133 65 78 87 97  Knee extension _0 Ankle dorsiflexion          Ankle plantarflexion          Ankle inversion          Ankle eversion           (Blank rows = not tested)   LOWER EXTREMITY MMT:   MMT Right eval Left eval Right 06/23/2022 Left 06/23/2022  Hip flexion        Hip extension        Hip abduction        Hip adduction        Hip internal rotation        Hip external rotation         Knee flexion        Knee extension 3-/5 5/5 5/5 43, 39 lbs 5/5 44.8, 43.6 lbs  Ankle dorsiflexion        Ankle plantarflexion        Ankle inversion        Ankle eversion         (Blank rows = not tested)   GAIT: 06/23/2022 : independent ambulation within clinic Household distances < 150 ft with lacking TKE in stnace, mild limit in hip extension/toe off progression c Rt leg.   Eval:  Distance walked: 100+ feet Assistive device utilized: Environmental consultant - 2 wheeled Level of assistance: Modified independence Comments: Step-to gait       TODAY'S TREATMENT: 06/23/2022:  Therex:  Recumbent bike Partial to full circles variable during 9 mins, seat 7  Seated Rt knee flexion c Lt leg overpressure 10 sec x 6  Seated Rt knee isometric ext hold 5 sec x 3 in 75 deg flexion  Supine heel prop TKE stretch 5 mins c start of vaso.   Review of HEP for quad set, slr, and other previous known interventions in HEP.    Verbal review of existing HEP (quad sets supine, overpressure stretch)  TherActivity (to improve stair navigation, squat functional movements)  6 inch step up on and down forward on Rt leg c light finger tip assist x 15  Double leg press 112 lbs x 15, Single leg press 56 lbs 2 x 15   Neuro Re-ed  SLS c vector reaching fwd/lateral/back contralateral leg x 8 bilateral  Vasopneumatic  Rt knee high 34 degrees in elevation 10 mins  06/19/2022 Quadriceps sets with heel prop 10X for 5 seconds Tailgate knee flexion 1 minute Knee flexion AAROM (L pushes R into flexion) 10X 10 seconds Prone knee extension stretch with rolled up towels above knees 3 minutes 1.5# Recumbent bike Seat 7 for AAROM (forward and back) 8 minutes  Functional Activities (for sit to stand and stairs) Leg Press double leg full extension (3 second hold) to as much flexion as possible 15X 112# slow eccentrics Single leg 56# 10X same hold with flexion and extension Step-up and over with UE support 8 inch step and up and  over no hands 6 inch step 10X each slow eccentrics Lateral step-down off 4 inch step with UE support  2 sets of 10 slow eccentrics  Neuromuscular re-education: single leg balance 5X 10 seconds B  Vaso 10 minutes R knee High 34*   06/16/2022 Therex:  UBE UE/LE lvl 1.0 full revolutions 10 mins , seat 10  Seated Rt quad set 5 sec hold x 5  Seated SLR c quad set 3 x 5  Heels off step gastroc stretch bilateral 30 sec x 3   Verbal review of existing HEP (quad sets supine, overpressure stretch)  TherActivity (to improve stair navigation, squat functional movements)  Fwd step up 4 inch WB on Rt x 10 (no UE assist)  Lateral step up 4 inch WB on Rt x 10 (no UE assist)  Single leg press 56 lbs 2 x 15   Manual  Seated Rt knee flexion/distraction/IR for flexion mobility gains/symptom relief c movement   Vasopneumatic  Rt knee high 34 degrees in elevation 10 mins     PATIENT EDUCATION:  Education details: HEP, see above Person educated: Patient and Child(ren) Education method: Explanation, Demonstration, Tactile cues, Verbal cues, and Handouts Education comprehension: verbalized understanding, returned demonstration, verbal cues required, tactile cues required, and needs further education     HOME EXERCISE PROGRAM: Access Code: [email or web address] URL: [email or web address] Date: 05/22/2022 Prepared by: Vista Mink  Exercises - Supine Quadricep Sets  - 5 x daily - 7 x weekly - 2 sets - 10 reps - 5 second hold - Seated Knee Flexion AAROM  - 3-5 x daily - 7 x weekly - 1 sets - 1 reps - 3 minutes hold - Seated Knee Extension Stretch with Chair  - 3-5 x daily - 7 x weekly - 1 sets - 1 reps - 3-5 minutes hold - Prone Knee Extension with Ankle Campos  - 3 x daily - 7 x weekly - 1 sets - 1 reps - 3-5 minutes hold    ASSESSMENT:   CLINICAL IMPRESSION:  Encouraged Pt that she could use SPC for prolonged walking distances in community to help offload Rt leg to facilitate reduced  symptoms c activity if she desired.  Difficulty in WB pressure on flexed knee due to instability in control and weakness still observed in several instances within interventions.  Improvement in this movement will improve ambulation and stair navigation.     OBJECTIVE IMPAIRMENTS Abnormal gait, decreased activity tolerance, decreased endurance, decreased knowledge of condition, decreased mobility, difficulty walking, decreased ROM, decreased strength, increased edema, impaired perceived functional ability, and pain.    ACTIVITY LIMITATIONS bending, standing, squatting, sleeping, stairs, bed mobility, and locomotion level   PARTICIPATION LIMITATIONS: driving, community activity, and occupation   PERSONAL FACTORS  No other factors  are also affecting patient's functional outcome.    REHAB POTENTIAL: Good   CLINICAL DECISION MAKING: Stable/uncomplicated   EVALUATION COMPLEXITY: Low     GOALS: Goals reviewed with patient? Yes   SHORT TERM GOALS: Target date: 06/17/2022  Improve Rt knee AROM to 5 to 90 degrees Baseline: 13 to 53 degrees Goal status: Met 3 - 97 on 06/12/2022   2.  Angela Campos will transition to a cane for walking Baseline: Wheeled walker Goal status: Met  06/04/2022   LONG TERM GOALS: Target date: 07/15/2022    Improve FOTO to 64 Baseline: 36 Goal status: On Going 52 on 06/12/2022   2.  Improve Rt knee pain to 0-3/10 on the Numeric Pain Rating Scale Baseline: 3-7/10 Goal status: On Going 06/19/2022   3.  Improve Rt quadriceps strength as assessed by FOTO, walking without an  AD and objective measures Baseline:  41 FOTO, walker and 3-/5 MMT Goal status: On Going 06/19/2022   4.  Improve Rt knee AROM to 3 to 110 degrees or better Baseline: 13 to 53 degrees Goal status: Partially Met 06/12/2022   5.  Angela Campos will be independent with her HEP at DC Baseline: Started 05/20/2022 Goal status: On Going 06/19/2022     PLAN: PT FREQUENCY: 1-2x/week   PT DURATION: 4 weeks    PLANNED INTERVENTIONS: Therapeutic exercises, Therapeutic activity, Neuromuscular re-education, Balance training, Gait training, Patient/Family education, Joint mobilization, Stair training, Electrical stimulation, Cryotherapy, Vasopneumatic device, and Manual therapy   PLAN FOR NEXT SESSION: Quad strengthening in various positioning, dynamic balance control in various flexed knee positioning to engage quadriceps.   ROM measurements in next visit or two.   Scot Jun, PT, DPT, OCS, ATC 06/23/22  9:29 AM      [facility] [address] [place], [place], [address] Phone: [phone]   Fax:  [phone]

## 2022-06-26 ENCOUNTER — Ambulatory Visit (INDEPENDENT_AMBULATORY_CARE_PROVIDER_SITE_OTHER): Payer: BC Managed Care – PPO | Admitting: Rehabilitative and Restorative Service Providers"

## 2022-06-26 ENCOUNTER — Encounter: Payer: Self-pay | Admitting: Rehabilitative and Restorative Service Providers"

## 2022-06-26 DIAGNOSIS — R262 Difficulty in walking, not elsewhere classified: Secondary | ICD-10-CM | POA: Diagnosis not present

## 2022-06-26 DIAGNOSIS — M25561 Pain in right knee: Secondary | ICD-10-CM

## 2022-06-26 DIAGNOSIS — M25661 Stiffness of right knee, not elsewhere classified: Secondary | ICD-10-CM | POA: Diagnosis not present

## 2022-06-26 DIAGNOSIS — R600 Localized edema: Secondary | ICD-10-CM | POA: Diagnosis not present

## 2022-06-26 DIAGNOSIS — M6281 Muscle weakness (generalized): Secondary | ICD-10-CM | POA: Diagnosis not present

## 2022-06-26 NOTE — Therapy (Signed)
Patient Details  Name: Angela Campos MRN: [id number] Date of Birth: Oct 13, 1971 Referring Provider:  No ref. provider found  Encounter Date: 06/26/2022   OUTPATIENT PHYSICAL THERAPY TREATMENT   Patient Name: Angela Campos MRN: [id number] DOB:19-Sep-1971, 51 y.o., female Today's Date: 06/26/2022     END OF SESSION:   PT End of Session - 06/26/22 1019     Visit Number 14    Number of Visits 22    Date for PT Re-Evaluation 07/15/22    Authorization - Number of Visits 22    Progress Note Due on Visit 20    PT Start Time 1016    PT Stop Time 1110    PT Time Calculation (min) 54 min    Activity Tolerance Patient tolerated treatment well;No increased pain    Behavior During Therapy [facility] for tasks assessed/performed             History reviewed. No pertinent past medical history. Past Surgical History:  Procedure Laterality Date   CESAREAN SECTION     1999 and 2001   DILATION AND CURETTAGE OF UTERUS  2001   KNEE CARTILAGE SURGERY Right    due to meniscus tears   TOOTH EXTRACTION     TOTAL KNEE ARTHROPLASTY Right 05/01/2022   Procedure: RIGHT TOTAL KNEE ARTHROPLASTY;  Surgeon: Mcarthur Rossetti, MD;  Location: [facility];  Service: Orthopedics;  Laterality: Right;   Patient Active Problem List   Diagnosis Date Noted   OA (osteoarthritis) of knee 05/01/2022   Status post total right knee replacement 05/01/2022   Status post arthroscopy of right knee 03/04/2018   Unilateral primary osteoarthritis, right knee 03/04/2018   Acute pain of right knee 01/27/2018     THERAPY DIAG:  Difficulty in walking, not elsewhere classified  Muscle weakness (generalized)  Localized edema  Stiffness of right knee, not elsewhere classified  Right knee pain, unspecified chronicity  REFERRING PROVIDER: Mcarthur Rossetti, MD   REFERRING DIAG: [id number] (ICD-10-CM) - Status post total right knee replacement    Rationale for Evaluation and Treatment Rehabilitation   ONSET DATE:  Surgery 05/01/2022   SUBJECTIVE:    SUBJECTIVE STATEMENT: Angela Campos is getting 2-3 hours of uninterrupted sleep.  She can "usually" get back to sleep but not always.  Still pain med free during the day with 1 oxycodone before bed.   PERTINENT HISTORY: Previous Rt knee surgery (meniscus)   PAIN:  NPRS scale: 2-6/10, 6/10 was after prolonged standing and walking while shopping Pain location: Rt knee Pain description: Achy, can be sharp Aggravating factors: nighttime, prolonged WB Relieving factors: Ice and pain medications   PRECAUTIONS: None   WEIGHT BEARING RESTRICTIONS No   FALLS:  Has patient fallen in last 6 months? No   LIVING ENVIRONMENT: Lives with: lives with their family Lives in: House/apartment Stairs: Yes: Internal: 15-25 steps; on left going up Has following equipment at home: [name] - 2 wheeled   OCCUPATION: Engineer, maintenance (IT) full-time   PLOF: Independent   PATIENT GOALS: Return to normal function.  Walking and work.     OBJECTIVE:    DIAGNOSTIC FINDINGS: IMPRESSION: Status post total right knee arthroplasty without evidence of hardware failure.  PATIENT SURVEYS:  FOTO 06/12/2022: 52 (Goal 64, was 36); FOTO 05/20/2022: 36 (Goal 64 in 16 visits)       SENSATION: No complaints of peripheral pains or paresthesias   EDEMA:  Noted but not measured   LOWER EXTREMITY ROM:   Active ROM Right 05/20/22 eval Left  05/20/22 eval Right 05/22/22 Right 05/29/22 Right 06/04/2022 Right 06/12/2022 Right 06/26/2022  Hip flexion           Hip extension           Hip abduction           Hip adduction           Hip internal rotation           Hip external rotation           Knee flexion 53 133 65 78 87 97 107  Knee extension _0 Ankle dorsiflexion           Ankle plantarflexion           Ankle inversion           Ankle eversion            (Blank rows = not tested)   LOWER EXTREMITY MMT:   MMT Right eval Left eval Right 06/23/2022 Left 06/23/2022  Hip flexion         Hip extension        Hip abduction        Hip adduction        Hip internal rotation        Hip external rotation        Knee flexion        Knee extension 3-/5 5/5 5/5 43, 39 lbs 5/5 44.8, 43.6 lbs  Ankle dorsiflexion        Ankle plantarflexion        Ankle inversion        Ankle eversion         (Blank rows = not tested)   GAIT: 06/23/2022 : Independent ambulation within clinic Household distances < 150 ft with lacking TKE in stnace, mild limit in hip extension/toe off progression c Rt leg.   Eval:  Distance walked: 100+ feet Assistive device utilized: Environmental consultant - 2 wheeled Level of assistance: Modified independence Comments: Step-to gait       TODAY'S TREATMENT: 06/26/2022 Seated straight leg raises 3 sets of 5 for 3 seconds Knee flexion AAROM (L pushes R into flexion) 10X 10 seconds Prone knee extension stretch with rolled up towels above knees 3 minutes 1.5# Recumbent bike Seat 7 for AAROM (forward and back) 7 minutes  Functional Activities (for sit to stand and stairs) Leg Press double leg full extension (3 second hold) to as much flexion as possible 15X 125# slow eccentrics Single leg 68# 10X same hold with flexion and extension  Neuromuscular re-education: single leg balance 5X 10 seconds B  Vaso 10 minutes R knee High 34*   06/23/2022:  Therex:  Recumbent bike Partial to full circles variable during 9 mins, seat 7  Seated Rt knee flexion c Lt leg overpressure 10 sec x 6  Seated Rt knee isometric ext hold 5 sec x 3 in 75 deg flexion  Supine heel prop TKE stretch 5 mins c start of vaso.   Review of HEP for quad set, slr, and other previous known interventions in HEP.    Verbal review of existing HEP (quad sets supine, overpressure stretch)  TherActivity (to improve stair navigation, squat functional movements)  6 inch step up on and down forward on Rt leg c light finger tip assist x 15  Double leg press 112 lbs x 15, Single leg press 56 lbs 2 x 15   Neuro  Re-ed  SLS  c vector reaching fwd/lateral/back contralateral leg x 8 bilateral  Vasopneumatic  Rt knee high 34 degrees in elevation 10 mins   06/19/2022 Quadriceps sets with heel prop 10X for 5 seconds Tailgate knee flexion 1 minute Knee flexion AAROM (L pushes R into flexion) 10X 10 seconds Prone knee extension stretch with rolled up towels above knees 3 minutes 1.5# Recumbent bike Seat 7 for AAROM (forward and back) 8 minutes  Functional Activities (for sit to stand and stairs) Leg Press double leg full extension (3 second hold) to as much flexion as possible 15X 112# slow eccentrics Single leg 56# 10X same hold with flexion and extension Step-up and over with UE support 8 inch step and up and over no hands 6 inch step 10X each slow eccentrics Lateral step-down off 4 inch step with UE support 2 sets of 10 slow eccentrics  Neuromuscular re-education: single leg balance 5X 10 seconds B  Vaso 10 minutes R knee High 34*    PATIENT EDUCATION:  Education details: HEP, see above Person educated: Patient and Child(ren) Education method: Explanation, Demonstration, Tactile cues, Verbal cues, and Handouts Education comprehension: verbalized understanding, returned demonstration, verbal cues required, tactile cues required, and needs further education     HOME EXERCISE PROGRAM: Access Code: [email or web address] URL: [email or web address] Date: 06/26/2022 Prepared by: Vista Mink  Exercises - Supine Quadricep Sets  - 5 x daily - 7 x weekly - 2 sets - 10 reps - 5 second hold - Seated Knee Flexion AAROM  - 3-5 x daily - 7 x weekly - 1 sets - 1 reps - 3 minutes hold - Seated Knee Extension Stretch with Chair  - 3-5 x daily - 7 x weekly - 1 sets - 1 reps - 3-5 minutes hold - Prone Knee Extension with Ankle Weight  - 3 x daily - 7 x weekly - 1 sets - 1 reps - 3-5 minutes hold   ASSESSMENT:   CLINICAL IMPRESSION:  Angela Campos is making excellent progress with her post-TKA physical  therapy.  AROM is 2 - 107 degrees.  Quadriceps fatigue is noted with strength activities and she notes some instability due to poor quadriceps endurance.  This is consistent with her report that her gait was affected by knee pain for 7 years before surgery.  Likely progress note next visit to objectively assess progress and make additional recommendations.     OBJECTIVE IMPAIRMENTS Abnormal gait, decreased activity tolerance, decreased endurance, decreased knowledge of condition, decreased mobility, difficulty walking, decreased ROM, decreased strength, increased edema, impaired perceived functional ability, and pain.    ACTIVITY LIMITATIONS bending, standing, squatting, sleeping, stairs, bed mobility, and locomotion level   PARTICIPATION LIMITATIONS: driving, community activity, and occupation   PERSONAL FACTORS  No other factors  are also affecting patient's functional outcome.    REHAB POTENTIAL: Good   CLINICAL DECISION MAKING: Stable/uncomplicated   EVALUATION COMPLEXITY: Low     GOALS: Goals reviewed with patient? Yes   SHORT TERM GOALS: Target date: 06/17/2022  Improve Rt knee AROM to 5 to 90 degrees Baseline: 13 to 53 degrees Goal status: Met 3 - 97 on 06/12/2022   2.  Angela Campos will transition to a cane for walking Baseline: Wheeled walker Goal status: Met  06/04/2022   LONG TERM GOALS: Target date: 07/15/2022    Improve FOTO to 64 Baseline: 36 Goal status: On Going 52 on 06/12/2022   2.  Improve Rt knee pain to 0-3/10 on the Numeric Pain Rating Scale Baseline: 3-7/10 Goal status: On  Going 06/26/2022   3.  Improve Rt quadriceps strength as assessed by FOTO, walking without an AD and objective measures Baseline:  36 FOTO, walker and 3-/5 MMT Goal status: On Going 06/26/2022   4.  Improve Rt knee AROM to 3 to 110 degrees or better Baseline: 13 to 53 degrees Goal status: Partially Met 06/26/2022   5.  Angela Campos will be independent with her HEP at DC Baseline: Started  05/20/2022 Goal status: On Going 06/26/2022     PLAN: PT FREQUENCY: 1-2x/week   PT DURATION: 4 weeks   PLANNED INTERVENTIONS: Therapeutic exercises, Therapeutic activity, Neuromuscular re-education, Balance training, Gait training, Patient/Family education, Joint mobilization, Stair training, Electrical stimulation, Cryotherapy, Vasopneumatic device, and Manual therapy   PLAN FOR NEXT SESSION: Quadriceps strengthening, dynamic balance and progress note/FOTO.  Farley Ly PT, MPT 06/26/22  2:45 PM      [facility] [address] [place], [place], [address] Phone: [phone]   Fax:  [phone]

## 2022-07-03 ENCOUNTER — Encounter: Payer: Self-pay | Admitting: Rehabilitative and Restorative Service Providers"

## 2022-07-03 ENCOUNTER — Ambulatory Visit (INDEPENDENT_AMBULATORY_CARE_PROVIDER_SITE_OTHER): Payer: BC Managed Care – PPO | Admitting: Rehabilitative and Restorative Service Providers"

## 2022-07-03 DIAGNOSIS — M25661 Stiffness of right knee, not elsewhere classified: Secondary | ICD-10-CM | POA: Diagnosis not present

## 2022-07-03 DIAGNOSIS — M6281 Muscle weakness (generalized): Secondary | ICD-10-CM

## 2022-07-03 DIAGNOSIS — M25561 Pain in right knee: Secondary | ICD-10-CM

## 2022-07-03 DIAGNOSIS — R262 Difficulty in walking, not elsewhere classified: Secondary | ICD-10-CM | POA: Diagnosis not present

## 2022-07-03 DIAGNOSIS — R600 Localized edema: Secondary | ICD-10-CM | POA: Diagnosis not present

## 2022-07-03 NOTE — Therapy (Signed)
Patient Details  Name: Angela Campos MRN: [id number] Date of Birth: 02-03-1971 Referring Provider:  No ref. provider found  Encounter Date: 07/03/2022   OUTPATIENT PHYSICAL THERAPY TREATMENT/PROGRESS NOTE   Patient Name: Angela Campos MRN: [id number] DOB:1971-04-14, 51 y.o., female Today's Date: 07/03/2022   Progress Note Reporting Period 05/20/2022 to 07/03/2022  See note below for Objective Data and Assessment of Progress/Goals.   END OF SESSION:   PT End of Session - 07/03/22 1026     Visit Number 15    Number of Visits 22    Date for PT Re-Evaluation 07/15/22    Authorization - Number of Visits 22    Progress Note Due on Visit 20    PT Start Time 1017    PT Stop Time 1109    PT Time Calculation (min) 52 min    Activity Tolerance Patient tolerated treatment well;No increased pain    Behavior During Therapy [facility] for tasks assessed/performed            History reviewed. No pertinent past medical history. Past Surgical History:  Procedure Laterality Date   CESAREAN SECTION     1999 and 2001   DILATION AND CURETTAGE OF UTERUS  2001   KNEE CARTILAGE SURGERY Right    due to meniscus tears   TOOTH EXTRACTION     TOTAL KNEE ARTHROPLASTY Right 05/01/2022   Procedure: RIGHT TOTAL KNEE ARTHROPLASTY;  Surgeon: Mcarthur Rossetti, MD;  Location: [facility];  Service: Orthopedics;  Laterality: Right;   Patient Active Problem List   Diagnosis Date Noted   OA (osteoarthritis) of knee 05/01/2022   Status post total right knee replacement 05/01/2022   Status post arthroscopy of right knee 03/04/2018   Unilateral primary osteoarthritis, right knee 03/04/2018   Acute pain of right knee 01/27/2018     THERAPY DIAG:  Difficulty in walking, not elsewhere classified  Muscle weakness (generalized)  Localized edema  Stiffness of right knee, not elsewhere classified  Right knee pain, unspecified chronicity  REFERRING PROVIDER: Mcarthur Rossetti, MD   REFERRING DIAG:  [phone] (ICD-10-CM) - Status post total right knee replacement    Rationale for Evaluation and Treatment Rehabilitation   ONSET DATE: Surgery 05/01/2022   SUBJECTIVE:    SUBJECTIVE STATEMENT: Angela Campos is getting 2-3 hours of uninterrupted sleep.  She can "usually" get back to sleep but not always.  Still pain med free during the day with 1 tylenol before bed, rarely an oxycodone.   PERTINENT HISTORY: Previous Rt knee surgery (meniscus)   PAIN:  NPRS scale: 2-6/10, 6/10 was after prolonged standing and walking while shopping Pain location: Rt knee Pain description: Achy, can be severe Aggravating factors: nighttime, prolonged WB Relieving factors: Ice and pain medications   PRECAUTIONS: None   WEIGHT BEARING RESTRICTIONS No   FALLS:  Has patient fallen in last 6 months? No   LIVING ENVIRONMENT: Lives with: lives with their family Lives in: House/apartment Stairs: Yes: Internal: 15-25 steps; on left going up Has following equipment at home: [name] - 2 wheeled   OCCUPATION: Engineer, maintenance (IT) full-time   PLOF: Independent   PATIENT GOALS: Return to normal function.  Walking and work.     OBJECTIVE:    DIAGNOSTIC FINDINGS: IMPRESSION: Status post total right knee arthroplasty without evidence of hardware failure.  PATIENT SURVEYS:  FOTO 07/03/2022: 52 (Goal 64) FOTO 06/12/2022: 52 (Goal 64, was 36); FOTO 05/20/2022: 36 (Goal 64 in 16 visits)       SENSATION: No complaints  of peripheral pains or paresthesias   EDEMA:  Noted but not measured   LOWER EXTREMITY ROM:   Active ROM Right 05/20/22 eval Left 05/20/22 eval Right 05/22/22 Right 05/29/22 Right 06/04/2022 Right 06/12/2022 Right 06/26/2022 Right 07/03/2022  Hip flexion            Hip extension            Hip abduction            Hip adduction            Hip internal rotation            Hip external rotation            Knee flexion 53 133 65 78 87 97 107 107  Knee extension _0 Ankle dorsiflexion            Ankle  plantarflexion            Ankle inversion            Ankle eversion             (Blank rows = not tested)   LOWER EXTREMITY MMT:   MMT Right eval Left eval Right 06/23/2022 Left 06/23/2022 L/R in pounds 07/03/2022  Hip flexion         Hip extension         Hip abduction         Hip adduction         Hip internal rotation         Hip external rotation         Knee flexion         Knee extension 3-/5 5/5 5/5 43, 39 lbs 5/5 44.8, 43.6 lbs 56.2/60.0  Ankle dorsiflexion         Ankle plantarflexion         Ankle inversion         Ankle eversion          (Blank rows = not tested)   GAIT: 07/03/2022: No AD, still has increased pain with prolonged WB like at the grocery store  06/23/2022: Independent ambulation within clinic Household distances < 150 ft with lacking TKE in stnace, mild limit in hip extension/toe off progression c Rt leg   Eval:  Distance walked: 100+ feet Assistive device utilized: Environmental consultant - 2 wheeled Level of assistance: Modified independence Comments: Step-to gait       TODAY'S TREATMENT: 07/03/2022 Seated straight leg raises 3 sets of 5 for 3 seconds Knee flexion AAROM (L pushes R into flexion) 10X 10 seconds Prone knee extension stretch with rolled up towels above knees 3 minutes 2# Recumbent bike Seat 7 for AAROM (forward and back) 7 minutes  Functional Activities (for sit to stand and stairs)  Single leg 68# 10X same hold with flexion and extension B Lateral step-down off 4 inch step 2 sets of 10B slow eccentrics Step-up and over    Vaso 10 minutes R knee High 34*   06/26/2022 Seated straight leg raises 3 sets of 5 for 3 seconds Knee flexion AAROM (L pushes R into flexion) 10X 10 seconds Prone knee extension stretch with rolled up towels above knees 3 minutes 1.5# Recumbent bike Seat 7 for AAROM (forward and back) 7 minutes  Functional Activities (for sit to stand and stairs) Leg Press double leg full extension (3 second hold) to as much flexion as  possible 15X 125# slow  eccentrics Single leg 68# 10X same hold with flexion and extension  Neuromuscular re-education: single leg balance 5X 10 seconds B  Vaso 10 minutes R knee High 34*   06/23/2022:  Therex:  Recumbent bike Partial to full circles variable during 9 mins, seat 7  Seated Rt knee flexion c Lt leg overpressure 10 sec x 6  Seated Rt knee isometric ext hold 5 sec x 3 in 75 deg flexion  Supine heel prop TKE stretch 5 mins c start of vaso.   Review of HEP for quad set, slr, and other previous known interventions in HEP.    Verbal review of existing HEP (quad sets supine, overpressure stretch)  TherActivity (to improve stair navigation, squat functional movements)  6 inch step up on and down forward on Rt leg c light finger tip assist x 15  Double leg press 112 lbs x 15, Single leg press 56 lbs 2 x 15   Neuro Re-ed  SLS c vector reaching fwd/lateral/back contralateral leg x 8 bilateral  Vasopneumatic  Rt knee high 34 degrees in elevation 10 mins    PATIENT EDUCATION:  Education details: HEP, see above Person educated: Patient and Child(ren) Education method: Explanation, Demonstration, Tactile cues, Verbal cues, and Handouts Education comprehension: verbalized understanding, returned demonstration, verbal cues required, tactile cues required, and needs further education     HOME EXERCISE PROGRAM: Access Code: [email or web address] URL: [email or web address] Date: 06/26/2022 Prepared by: Vista Mink  Exercises - Supine Quadricep Sets  - 5 x daily - 7 x weekly - 2 sets - 10 reps - 5 second hold - Seated Knee Flexion AAROM  - 3-5 x daily - 7 x weekly - 1 sets - 1 reps - 3 minutes hold - Seated Knee Extension Stretch with Chair  - 3-5 x daily - 7 x weekly - 1 sets - 1 reps - 3-5 minutes hold - Prone Knee Extension with Ankle Weight  - 3 x daily - 7 x weekly - 1 sets - 1 reps - 3-5 minutes hold   ASSESSMENT:   CLINICAL IMPRESSION:  Seva continues to make  excellent progress with her post-TKA physical therapy.  AROM is 1 - 107 degrees.  Quadriceps fatigue is noted with strength activities, she notes some instability due to poor quadriceps endurance and this is consistent with difficulty with prolonged WB.  Because gait was affected by knee pain for 7 years before surgery, she will benefit from another 2 PT visits to progress strength and make sure Monita is comfortable with her long-term HEP before likely transfer into independent PT in 2 weeks.     OBJECTIVE IMPAIRMENTS Abnormal gait, decreased activity tolerance, decreased endurance, decreased knowledge of condition, decreased mobility, difficulty walking, decreased ROM, decreased strength, increased edema, impaired perceived functional ability, and pain.    ACTIVITY LIMITATIONS bending, standing, squatting, sleeping, stairs, bed mobility, and locomotion level   PARTICIPATION LIMITATIONS: driving, community activity, and occupation   PERSONAL FACTORS  No other factors  are also affecting patient's functional outcome.    REHAB POTENTIAL: Good   CLINICAL DECISION MAKING: Stable/uncomplicated   EVALUATION COMPLEXITY: Low     GOALS: Goals reviewed with patient? Yes   SHORT TERM GOALS: Target date: 06/17/2022  Improve Rt knee AROM to 5 to 90 degrees Baseline: 13 to 53 degrees Goal status: Met 3 - 97 on 06/12/2022   2.  Justice will transition to a cane for walking Baseline: Wheeled walker Goal status: Met  06/04/2022   LONG TERM GOALS: Target  date: 07/15/2022    Improve FOTO to 12 Baseline: 36 Goal status: On Going 52 on 07/03/2022   2.  Improve Rt knee pain to 0-3/10 on the Numeric Pain Rating Scale Baseline: 3-7/10 Goal status: On Going 07/03/2022   3.  Improve Rt quadriceps strength as assessed by FOTO, walking without an AD and objective measures Baseline:  36 FOTO, walker and 3-/5 MMT Goal status: On Going 07/03/2022   4.  Improve Rt knee AROM to 3 to 110 degrees or better Baseline: 13  to 53 degrees Goal status: Partially Met 07/03/2022   5.  Lianette will be independent with her HEP at DC Baseline: Started 05/20/2022 Goal status: Met 07/03/2022     PLAN: PT FREQUENCY: 1x/week   PT DURATION: 2 weeks   PLANNED INTERVENTIONS: Therapeutic exercises, Therapeutic activity, Neuromuscular re-education, Balance training, Gait training, Patient/Family education, Joint mobilization, Stair training, Electrical stimulation, Cryotherapy, Vasopneumatic device, and Manual therapy   PLAN FOR NEXT SESSION: Quadriceps strengthening, dynamic balance and update HEP.  Farley Ly PT, MPT 07/03/22  4:29 PM      [facility] [address] [place], [place], [address] Phone: [phone]   Fax:  [phone]

## 2022-07-10 ENCOUNTER — Ambulatory Visit (INDEPENDENT_AMBULATORY_CARE_PROVIDER_SITE_OTHER): Payer: BC Managed Care – PPO | Admitting: Rehabilitative and Restorative Service Providers"

## 2022-07-10 ENCOUNTER — Encounter: Payer: Self-pay | Admitting: Rehabilitative and Restorative Service Providers"

## 2022-07-10 DIAGNOSIS — R262 Difficulty in walking, not elsewhere classified: Secondary | ICD-10-CM | POA: Diagnosis not present

## 2022-07-10 DIAGNOSIS — M25661 Stiffness of right knee, not elsewhere classified: Secondary | ICD-10-CM | POA: Diagnosis not present

## 2022-07-10 DIAGNOSIS — M25561 Pain in right knee: Secondary | ICD-10-CM

## 2022-07-10 DIAGNOSIS — M6281 Muscle weakness (generalized): Secondary | ICD-10-CM | POA: Diagnosis not present

## 2022-07-10 DIAGNOSIS — R600 Localized edema: Secondary | ICD-10-CM

## 2022-07-10 NOTE — Therapy (Signed)
Patient Details  Name: Angela Campos MRN: [id number] Date of Birth: 1970/12/08 Referring Provider:  No ref. provider found  Encounter Date: 07/10/2022   OUTPATIENT PHYSICAL THERAPY TREATMENT NOTE   Patient Name: Angela Campos MRN: [id number] DOB:1971/03/05, 51 y.o., female Today's Date: 07/10/2022   END OF SESSION:   PT End of Session - 07/10/22 1105     Visit Number 16    Number of Visits 22    Date for PT Re-Evaluation 07/15/22    Authorization - Number of Visits 22    Progress Note Due on Visit 20    PT Start Time 1102    PT Stop Time 1151    PT Time Calculation (min) 49 min    Activity Tolerance Patient tolerated treatment well;No increased pain    Behavior During Therapy [facility] for tasks assessed/performed             History reviewed. No pertinent past medical history. Past Surgical History:  Procedure Laterality Date   CESAREAN SECTION     1999 and 2001   DILATION AND CURETTAGE OF UTERUS  2001   KNEE CARTILAGE SURGERY Right    due to meniscus tears   TOOTH EXTRACTION     TOTAL KNEE ARTHROPLASTY Right 05/01/2022   Procedure: RIGHT TOTAL KNEE ARTHROPLASTY;  Surgeon: Mcarthur Rossetti, MD;  Location: [facility];  Service: Orthopedics;  Laterality: Right;   Patient Active Problem List   Diagnosis Date Noted   OA (osteoarthritis) of knee 05/01/2022   Status post total right knee replacement 05/01/2022   Status post arthroscopy of right knee 03/04/2018   Unilateral primary osteoarthritis, right knee 03/04/2018   Acute pain of right knee 01/27/2018     THERAPY DIAG:  Difficulty in walking, not elsewhere classified  Muscle weakness (generalized)  Localized edema  Stiffness of right knee, not elsewhere classified  Right knee pain, unspecified chronicity  REFERRING PROVIDER: Mcarthur Rossetti, MD   REFERRING DIAG: [phone] (ICD-10-CM) - Status post total right knee replacement    Rationale for Evaluation and Treatment Rehabilitation   ONSET DATE:  Surgery 05/01/2022   SUBJECTIVE:    SUBJECTIVE STATEMENT: Angela Campos took things relatively easy over the past week.  She is still getting 2-3 hours of uninterrupted sleep and a total of 6-7 hours total sleep.  1 tylenol before bed daily, 1X oxycodone this week.   PERTINENT HISTORY: Previous Rt knee surgery (meniscus)   PAIN:  NPRS scale: 1-5/10 Pain location: Rt knee Pain description: Achy, can be sore Aggravating factors: nighttime, prolonged WB Relieving factors: Ice and pain medications   PRECAUTIONS: None   WEIGHT BEARING RESTRICTIONS No   FALLS:  Has patient fallen in last 6 months? No   LIVING ENVIRONMENT: Lives with: lives with their family Lives in: House/apartment Stairs: Yes: Internal: 15-25 steps; on left going up Has following equipment at home: [name] - 2 wheeled   OCCUPATION: Engineer, maintenance (IT) full-time   PLOF: Independent   PATIENT GOALS: Return to normal function.  Walking and work.     OBJECTIVE:    DIAGNOSTIC FINDINGS: IMPRESSION: Status post total right knee arthroplasty without evidence of hardware failure.  PATIENT SURVEYS:  FOTO 07/03/2022: 52 (Goal 64) FOTO 06/12/2022: 52 (Goal 64, was 36); FOTO 05/20/2022: 36 (Goal 64 in 16 visits)       SENSATION: No complaints of peripheral pains or paresthesias   EDEMA:  Noted but not measured   LOWER EXTREMITY ROM:   Active ROM Right 05/20/22 eval Left  05/20/22 eval Right 05/22/22 Right 05/29/22 Right 06/04/2022 Right 06/12/2022 Right 06/26/2022 Right 07/03/2022  Hip flexion            Hip extension            Hip abduction            Hip adduction            Hip internal rotation            Hip external rotation            Knee flexion 53 133 65 78 87 97 107 107  Knee extension '13 1 8 6 5 3 2 1  ' Ankle dorsiflexion            Ankle plantarflexion            Ankle inversion            Ankle eversion             (Blank rows = not tested)   LOWER EXTREMITY MMT:   MMT Right eval Left eval Right 06/23/2022  Left 06/23/2022 L/R in pounds 07/03/2022  Hip flexion         Hip extension         Hip abduction         Hip adduction         Hip internal rotation         Hip external rotation         Knee flexion         Knee extension 3-/5 5/5 5/5 43, 39 lbs 5/5 44.8, 43.6 lbs 56.2/60.0  Ankle dorsiflexion         Ankle plantarflexion         Ankle inversion         Ankle eversion          (Blank rows = not tested)   GAIT: 07/03/2022: No AD, still has increased pain with prolonged WB like at the grocery store  06/23/2022: Independent ambulation within clinic Household distances < 150 ft with lacking TKE in stnace, mild limit in hip extension/toe off progression c Rt leg   Eval:  Distance walked: 100+ feet Assistive device utilized: Environmental consultant - 2 wheeled Level of assistance: Modified independence Comments: Step-to gait       TODAY'S TREATMENT: 07/10/2022 Seated straight leg raises 3 sets of 5 for 3 seconds with 1# Knee flexion AAROM (L pushes R into flexion) 10X 10 seconds Prone knee extension stretch with rolled up towels above knees 3 minutes 2# Recumbent bike Seat 7 full range 6 minutes  Functional Activities (for sit to stand and stairs)  Single leg 68# 2 sets of 10X 2-3 seconds hold with flexion and extension, B slow eccentrics Lateral step-down off 4 inch step 2 sets of 10 B slow eccentrics  Step-up and over 8 inch step slow eccentrics 10X and stairwell reciprocally 5X up and down   Vaso 10 minutes R knee High 34*   07/03/2022 Seated straight leg raises 3 sets of 5 for 3 seconds Knee flexion AAROM (L pushes R into flexion) 10X 10 seconds Prone knee extension stretch with rolled up towels above knees 3 minutes 2# Recumbent bike Seat 7 for AAROM (forward and back) 7 minutes  Functional Activities (for sit to stand and stairs)  Single leg 68# 10X same hold with flexion and extension B Lateral step-down off 4 inch step 2 sets of 10B slow eccentrics Step-up and  over    Vaso 10  minutes R knee High 34*   06/26/2022 Seated straight leg raises 3 sets of 5 for 3 seconds Knee flexion AAROM (L pushes R into flexion) 10X 10 seconds Prone knee extension stretch with rolled up towels above knees 3 minutes 1.5# Recumbent bike Seat 7 for AAROM (forward and back) 7 minutes  Functional Activities (for sit to stand and stairs) Leg Press double leg full extension (3 second hold) to as much flexion as possible 15X 125# slow eccentrics Single leg 68# 10X same hold with flexion and extension  Neuromuscular re-education: single leg balance 5X 10 seconds B  Vaso 10 minutes R knee High 34*   PATIENT EDUCATION:  Education details: HEP, see above Person educated: Patient and Child(ren) Education method: Explanation, Demonstration, Tactile cues, Verbal cues, and Handouts Education comprehension: verbalized understanding, returned demonstration, verbal cues required, tactile cues required, and needs further education     HOME EXERCISE PROGRAM: Access Code: [email or web address] URL: [email or web address] Date: 06/26/2022 Prepared by: Vista Mink  Exercises - Supine Quadricep Sets  - 5 x daily - 7 x weekly - 2 sets - 10 reps - 5 second hold - Seated Knee Flexion AAROM  - 3-5 x daily - 7 x weekly - 1 sets - 1 reps - 3 minutes hold - Seated Knee Extension Stretch with Chair  - 3-5 x daily - 7 x weekly - 1 sets - 1 reps - 3-5 minutes hold - Prone Knee Extension with Ankle Weight  - 3 x daily - 7 x weekly - 1 sets - 1 reps - 3-5 minutes hold   ASSESSMENT:   CLINICAL IMPRESSION:  Angela Campos notes she took things relatively easy this week.  Quadriceps strength and endurance are still the focus of her home and clinic programs.  She will benefit from a final PT visit to update her long-term HEP and make sure she is comfortable with her long-term HEP before likely transfer into independent PT in a week.     OBJECTIVE IMPAIRMENTS Abnormal gait, decreased activity tolerance, decreased  endurance, decreased knowledge of condition, decreased mobility, difficulty walking, decreased ROM, decreased strength, increased edema, impaired perceived functional ability, and pain.    ACTIVITY LIMITATIONS bending, standing, squatting, sleeping, stairs, bed mobility, and locomotion level   PARTICIPATION LIMITATIONS: driving, community activity, and occupation   PERSONAL FACTORS  No other factors  are also affecting patient's functional outcome.    REHAB POTENTIAL: Good   CLINICAL DECISION MAKING: Stable/uncomplicated   EVALUATION COMPLEXITY: Low     GOALS: Goals reviewed with patient? Yes   SHORT TERM GOALS: Target date: 06/17/2022  Improve Rt knee AROM to 5 to 90 degrees Baseline: 13 to 53 degrees Goal status: Met 3 - 97 on 06/12/2022   2.  Katira will transition to a cane for walking Baseline: Wheeled walker Goal status: Met  06/04/2022   LONG TERM GOALS: Target date: 07/15/2022    Improve FOTO to 64 Baseline: 36 Goal status: On Going 52 on 07/03/2022   2.  Improve Rt knee pain to 0-3/10 on the Numeric Pain Rating Scale Baseline: 3-7/10 Goal status: On Going 07/10/2022   3.  Improve Rt quadriceps strength as assessed by FOTO, walking without an AD and objective measures Baseline:  36 FOTO, walker and 3-/5 MMT Goal status: On Going 07/10/2022   4.  Improve Rt knee AROM to 3 to 110 degrees or better Baseline: 13 to 53 degrees Goal status: Partially Met 07/03/2022   5.  Amalee will be independent with her HEP at DC Baseline: Started 05/20/2022 Goal status: Met 07/10/2022     PLAN: PT FREQUENCY: 1x/week   PT DURATION: 1 week   PLANNED INTERVENTIONS: Therapeutic exercises, Therapeutic activity, Neuromuscular re-education, Balance training, Gait training, Patient/Family education, Joint mobilization, Stair training, Electrical stimulation, Cryotherapy, Vasopneumatic device, and Manual therapy   PLAN FOR NEXT SESSION: Progress note, FOTO, Quadriceps strengthening, dynamic  balance and update final DC HEP.  Farley Ly PT, MPT 07/10/22  2:07 PM      [facility] [address] [place], [place], [address] Phone: [phone]   Fax:  [phone]

## 2022-07-17 ENCOUNTER — Encounter: Payer: Self-pay | Admitting: Rehabilitative and Restorative Service Providers"

## 2022-07-17 ENCOUNTER — Ambulatory Visit (INDEPENDENT_AMBULATORY_CARE_PROVIDER_SITE_OTHER): Payer: BC Managed Care – PPO | Admitting: Orthopaedic Surgery

## 2022-07-17 ENCOUNTER — Ambulatory Visit (INDEPENDENT_AMBULATORY_CARE_PROVIDER_SITE_OTHER): Payer: BC Managed Care – PPO

## 2022-07-17 ENCOUNTER — Ambulatory Visit (INDEPENDENT_AMBULATORY_CARE_PROVIDER_SITE_OTHER): Payer: BC Managed Care – PPO | Admitting: Rehabilitative and Restorative Service Providers"

## 2022-07-17 DIAGNOSIS — M6281 Muscle weakness (generalized): Secondary | ICD-10-CM | POA: Diagnosis not present

## 2022-07-17 DIAGNOSIS — R262 Difficulty in walking, not elsewhere classified: Secondary | ICD-10-CM | POA: Diagnosis not present

## 2022-07-17 DIAGNOSIS — R600 Localized edema: Secondary | ICD-10-CM

## 2022-07-17 DIAGNOSIS — M25661 Stiffness of right knee, not elsewhere classified: Secondary | ICD-10-CM

## 2022-07-17 DIAGNOSIS — M25561 Pain in right knee: Secondary | ICD-10-CM

## 2022-07-17 DIAGNOSIS — Z96651 Presence of right artificial knee joint: Secondary | ICD-10-CM

## 2022-07-17 NOTE — Therapy (Signed)
Patient Details  Name: Angela Campos MRN: [id number] Date of Birth: 1971-01-11 Referring Provider:  No ref. provider found  Encounter Date: 07/17/2022   OUTPATIENT PHYSICAL THERAPY TREATMENT/PROGRESS NOTE   Patient Name: Angela Campos MRN: [id number] DOB:Jan 21, 1971, 51 y.o., female Today's Date: 07/17/2022  Progress Note Reporting Period 05/20/2022 to 07/17/2022  See note below for Objective Data and Assessment of Progress/Goals.       END OF SESSION:   PT End of Session - 07/17/22 0805     Visit Number 17    Number of Visits 22    Date for PT Re-Evaluation 07/15/22    Authorization - Number of Visits 22    Progress Note Due on Visit 20    PT Start Time 0800    PT Stop Time 0845    PT Time Calculation (min) 45 min    Activity Tolerance Patient tolerated treatment well;No increased pain    Behavior During Therapy [facility] for tasks assessed/performed            History reviewed. No pertinent past medical history. Past Surgical History:  Procedure Laterality Date   CESAREAN SECTION     1999 and 2001   DILATION AND CURETTAGE OF UTERUS  2001   KNEE CARTILAGE SURGERY Right    due to meniscus tears   TOOTH EXTRACTION     TOTAL KNEE ARTHROPLASTY Right 05/01/2022   Procedure: RIGHT TOTAL KNEE ARTHROPLASTY;  Surgeon: Mcarthur Rossetti, MD;  Location: [place];  Service: Orthopedics;  Laterality: Right;   Patient Active Problem List   Diagnosis Date Noted   OA (osteoarthritis) of knee 05/01/2022   Status post total right knee replacement 05/01/2022   Status post arthroscopy of right knee 03/04/2018   Unilateral primary osteoarthritis, right knee 03/04/2018   Acute pain of right knee 01/27/2018     THERAPY DIAG:  Difficulty in walking, not elsewhere classified  Muscle weakness (generalized)  Localized edema  Stiffness of right knee, not elsewhere classified  Right knee pain, unspecified chronicity  REFERRING PROVIDER: Mcarthur Rossetti, MD   REFERRING  DIAG: [phone] (ICD-10-CM) - Status post total right knee replacement    Rationale for Evaluation and Treatment Rehabilitation   ONSET DATE: Surgery 05/01/2022   SUBJECTIVE:    SUBJECTIVE STATEMENT: Angela Campos is sleeping normally (for her).  She is pleased with her progress but thinks she "should be further along."   PERTINENT HISTORY: Previous Rt knee surgery (meniscus)   PAIN:  NPRS scale: 1-5/10 Pain location: Rt knee Pain description: Achy Aggravating factors: Prolonged postures and WB Relieving factors: Ice and over the counter pain medications, 1X oxycodone this week   PRECAUTIONS: None   WEIGHT BEARING RESTRICTIONS No   FALLS:  Has patient fallen in last 6 months? No   LIVING ENVIRONMENT: Lives with: lives with their family Lives in: House/apartment Stairs: Yes: Internal: 15-25 steps; on left going up Has following equipment at home: [name] - 2 wheeled   OCCUPATION: Engineer, maintenance (IT) full-time   PLOF: Independent   PATIENT GOALS: Return to normal function.  Walking and work.     OBJECTIVE:    DIAGNOSTIC FINDINGS: IMPRESSION: Status post total right knee arthroplasty without evidence of hardware failure.  PATIENT SURVEYS:  07/17/2022 FOTO 61 (Goal 64) FOTO 07/03/2022: 52 (Goal 64) FOTO 06/12/2022: 52 (Goal 64, was 36); FOTO 05/20/2022: 36 (Goal 64 in 16 visits)       SENSATION: No complaints of peripheral pains or paresthesias   EDEMA:  Noted but  not measured   LOWER EXTREMITY ROM:   Active ROM Right 05/20/22 eval Left 05/20/22 eval Right 05/22/22 Right 05/29/22 Right 06/04/2022 Right 06/12/2022 Right 06/26/2022 Right 07/03/2022 Right 07/17/2022  Hip flexion             Hip extension             Hip abduction             Hip adduction             Hip internal rotation             Hip external rotation             Knee flexion 53 133 65 78 87 97 107 107 111  Knee extension _0 Ankle dorsiflexion             Ankle plantarflexion             Ankle inversion              Ankle eversion              (Blank rows = not tested)   LOWER EXTREMITY MMT:   MMT Right eval Left eval Right 06/23/2022 Left 06/23/2022 L/R in pounds 07/03/2022  Hip flexion         Hip extension         Hip abduction         Hip adduction         Hip internal rotation         Hip external rotation         Knee flexion         Knee extension 3-/5 5/5 5/5 43, 39 lbs 5/5 44.8, 43.6 lbs 56.2/60.0  Ankle dorsiflexion         Ankle plantarflexion         Ankle inversion         Ankle eversion          (Blank rows = not tested)   GAIT: 07/17/2022: Significant for mild R lateral lean without an assistive device.  This is being addressed with hip abductors strengthening.  Stairs look very good for < 3 months post-TKA.  07/03/2022: No AD, still has increased pain with prolonged WB like at the grocery store  06/23/2022: Independent ambulation within clinic Household distances < 150 ft with lacking TKE in stnace, mild limit in hip extension/toe off progression c Rt leg   Eval:  Distance walked: 100+ feet Assistive device utilized: Environmental consultant - 2 wheeled Level of assistance: Modified independence Comments: Step-to gait       TODAY'S TREATMENT: 07/17/2022 Seated straight leg raises 3 sets of 5 for 3 seconds with 1# Knee flexion AAROM (L pushes R into flexion) 10X 10 seconds Recumbent bike Seat 7 full range 8 minutes  Functional Activities (for sit to stand and stairs)  Single leg 75# 2 sets of 10X 2-3 seconds hold with flexion and extension, B slow eccentrics Up and down a flight of stairs in the clinic reciprocally, no hands 4X    Sit to stand with slow eccentrics 5X   Vaso 10 minutes R knee High 34* NEXT VISIT   07/10/2022 Seated straight leg raises 3 sets of 5 for 3 seconds with 1# Knee flexion AAROM (L pushes R into flexion) 10X 10 seconds Prone knee extension stretch with rolled up towels above knees 3 minutes 2# Recumbent  bike Seat 7 full range 6  minutes  Functional Activities (for sit to stand and stairs)  Single leg 68# 2 sets of 10X 2-3 seconds hold with flexion and extension, B slow eccentrics Lateral step-down off 4 inch step 2 sets of 10 B slow eccentrics  Step-up and over 8 inch step slow eccentrics 10X and stairwell reciprocally 5X up and down   Vaso 10 minutes R knee High 34*   07/03/2022 Seated straight leg raises 3 sets of 5 for 3 seconds Knee flexion AAROM (L pushes R into flexion) 10X 10 seconds Prone knee extension stretch with rolled up towels above knees 3 minutes 2# Recumbent bike Seat 7 for AAROM (forward and back) 7 minutes  Functional Activities (for sit to stand and stairs)  Single leg 68# 10X same hold with flexion and extension B Lateral step-down off 4 inch step 2 sets of 10B slow eccentrics Step-up and over    Vaso 10 minutes R knee High 34*   PATIENT EDUCATION:  Education details: HEP, see above Person educated: Patient and Child(ren) Education method: Explanation, Demonstration, Tactile cues, Verbal cues, and Handouts Education comprehension: verbalized understanding, returned demonstration, verbal cues required, tactile cues required, and needs further education     HOME EXERCISE PROGRAM: Access Code: [email or web address] URL: [email or web address] Date: 07/17/2022 Prepared by: Vista Mink  Exercises - Supine Quadricep Sets  - 5 x daily - 7 x weekly - 2 sets - 10 reps - 5 second hold - Seated Knee Flexion AAROM  - 3-5 x daily - 7 x weekly - 1 sets - 1 reps - 3 minutes hold - Seated Knee Extension Stretch with Chair  - 3-5 x daily - 7 x weekly - 1 sets - 1 reps - 3-5 minutes hold - Prone Knee Extension with Ankle Weight  - 3 x daily - 7 x weekly - 1 sets - 1 reps - 3-5 minutes hold - Standing Hip Hiking  - 1 x daily - 7 x weekly - 2-3 sets - 5 reps - 3-5 seconds hold   ASSESSMENT:   CLINICAL IMPRESSION:  Mayela is doing a great job with her post-TKA rehabilitation.  AROM is 1 -  111 degrees.  Strength is objectively very good and she is able to ascend and descend stairs reciprocally without a handrail with some apprehension (although it looks good).  Other than some R hip abductors weakness (being addressed with her HEP), continued quadriceps strengthening and edema reduction, she has met LTGs established at evaluation.  If Dr. Ninfa Linden agrees, I believe Katiejo will benefit from 1 final DC visit to review her HEP as we ran out of time today due to functional assessment and discussions regarding long-term PT at home.  I expect Cyndal will meet all long-term goals with continued solid HEP compliance and she is welcome to return for additional supervised PT at any time if requested.   OBJECTIVE IMPAIRMENTS Abnormal gait, decreased activity tolerance, decreased endurance, decreased knowledge of condition, decreased mobility, difficulty walking, decreased ROM, decreased strength, increased edema, impaired perceived functional ability, and pain.    ACTIVITY LIMITATIONS bending, standing, squatting, sleeping, stairs, bed mobility, and locomotion level   PARTICIPATION LIMITATIONS: driving, community activity, and occupation   PERSONAL FACTORS  No other factors  are also affecting patient's functional outcome.    REHAB POTENTIAL: Good   CLINICAL DECISION MAKING: Stable/uncomplicated   EVALUATION COMPLEXITY: Low     GOALS: Goals reviewed with patient? Yes   SHORT TERM GOALS: Target date:  06/17/2022  Improve Rt knee AROM to 5 to 90 degrees Baseline: 13 to 53 degrees Goal status: Met 3 - 97 on 06/12/2022   2.  Tahara will transition to a cane for walking Baseline: Wheeled walker Goal status: Met  06/04/2022   LONG TERM GOALS: Target date: 07/15/2022    Improve FOTO to 64 Baseline: 36 Goal status: On Going 61 on 07/17/2022   2.  Improve Rt knee pain to 0-3/10 on the Numeric Pain Rating Scale Baseline: 3-7/10 Goal status: On Going 07/17/2022   3.  Improve Rt quadriceps  strength as assessed by FOTO, walking without an AD and objective measures Baseline:  36 FOTO, walker and 3-/5 MMT Goal status: Met 07/17/2022   4.  Improve Rt knee AROM to 3 to 110 degrees or better Baseline: 13 to 53 degrees Goal status: Met 07/17/2022   5.  Milarose will be independent with her HEP at DC Baseline: Started 05/20/2022 Goal status: Will be met next visit 07/17/2022     PLAN: PT FREQUENCY: 1x/week   PT DURATION: 1 week   PLANNED INTERVENTIONS: Therapeutic exercises, Therapeutic activity, Neuromuscular re-education, Balance training, Gait training, Patient/Family education, Joint mobilization, Stair training, Electrical stimulation, Cryotherapy, Vasopneumatic device, and Manual therapy   PLAN FOR NEXT SESSION: Check performance of HEP including hip abductors strength activities and review final HEP.  DC.  Farley Ly PT, MPT 07/17/22  11:41 AM      [facility] [address] [place], [place], [address] Phone: [phone]   Fax:  [phone]

## 2022-07-17 NOTE — Progress Notes (Signed)
States he is getting close to 3 months status post a right total knee arthroplasty.  This was secondary to significant posttraumatic arthritis and a previous ACL reconstruction.  She is pushing well through physical therapy and reports good range of motion and strength.  I did review the therapy notes and they are also pleased with her progress thus far.  On my exam in the office today she has almost full extension to past 110 degrees flexion.  The knee is still swollen to be expected but is ligamentously stable.  2 views of the right knee show well-seated total knee arthroplasty with no complicating features.  This is a press-fit implant.  She will continue to increase her activities as comfort allows.  The next time I need to see is not for 6 months unless there are issues with the knee.  At that visit we will have a final AP and lateral of the right knee.  All question concerns were answered and addressed.

## 2022-07-31 ENCOUNTER — Encounter: Payer: Self-pay | Admitting: Rehabilitative and Restorative Service Providers"

## 2022-07-31 ENCOUNTER — Ambulatory Visit (INDEPENDENT_AMBULATORY_CARE_PROVIDER_SITE_OTHER): Payer: BC Managed Care – PPO | Admitting: Rehabilitative and Restorative Service Providers"

## 2022-07-31 DIAGNOSIS — R600 Localized edema: Secondary | ICD-10-CM

## 2022-07-31 DIAGNOSIS — M6281 Muscle weakness (generalized): Secondary | ICD-10-CM

## 2022-07-31 DIAGNOSIS — R262 Difficulty in walking, not elsewhere classified: Secondary | ICD-10-CM

## 2022-07-31 DIAGNOSIS — M25561 Pain in right knee: Secondary | ICD-10-CM

## 2022-07-31 DIAGNOSIS — M25661 Stiffness of right knee, not elsewhere classified: Secondary | ICD-10-CM | POA: Diagnosis not present

## 2022-07-31 NOTE — Therapy (Addendum)
Patient Details  Name: Angela Campos MRN: [id number] Date of Birth: [birth year] Referring Provider:  No ref. provider found  Encounter Date: 07/31/2022   OUTPATIENT PHYSICAL THERAPY TREATMENT/DC NOTE   Patient Name: Angela Campos MRN: [id number] DOB:08-18-1971, 51 y.o., female Today's Date: 09/04/2022  PHYSICAL THERAPY DISCHARGE SUMMARY  Visits from Start of Care: 18  Current functional level related to goals / functional outcomes: See note   Remaining deficits: See note   Education / Equipment: HEP updated   Patient agrees to discharge. Patient goals were met. Patient is being discharged due to being pleased with the current functional level.    END OF SESSION:     History reviewed. No pertinent past medical history. Past Surgical History:  Procedure Laterality Date   CESAREAN SECTION     1999 and 2001   DILATION AND CURETTAGE OF UTERUS  2001   KNEE CARTILAGE SURGERY Right    due to meniscus tears   TOOTH EXTRACTION     TOTAL KNEE ARTHROPLASTY Right 05/01/2022   Procedure: RIGHT TOTAL KNEE ARTHROPLASTY;  Surgeon: Mcarthur Rossetti, MD;  Location: [place];  Service: Orthopedics;  Laterality: Right;   Patient Active Problem List   Diagnosis Date Noted   OA (osteoarthritis) of knee 05/01/2022   Status post total right knee replacement 05/01/2022   Status post arthroscopy of right knee 03/04/2018   Unilateral primary osteoarthritis, right knee 03/04/2018   Acute pain of right knee 01/27/2018     THERAPY DIAG:  Difficulty in walking, not elsewhere classified  Muscle weakness (generalized)  Localized edema  Stiffness of right knee, not elsewhere classified  Right knee pain, unspecified chronicity  REFERRING PROVIDER: Mcarthur Rossetti, MD   REFERRING DIAG: [id number] (ICD-10-CM) - Status post total right knee replacement    Rationale for Evaluation and Treatment Rehabilitation   ONSET DATE: Surgery 05/01/2022   SUBJECTIVE:    SUBJECTIVE  STATEMENT: Angela Campos is concerned about her sleep.  She normally doesn't sleep great but feels as if sleep is limited by her knee.   PERTINENT HISTORY: Previous Rt knee surgery (meniscus)   PAIN:  NPRS scale: 1-4/10 this week Pain location: Rt knee Pain description: Achy Aggravating factors: Prolonged postures and WB Relieving factors: Ice and over the counter pain medications   PRECAUTIONS: None   WEIGHT BEARING RESTRICTIONS No   FALLS:  Has patient fallen in last 6 months? No   LIVING ENVIRONMENT: Lives with: lives with their family Lives in: House/apartment Stairs: Yes: Internal: 15-25 steps; on left going up Has following equipment at home: [name] - 2 wheeled   OCCUPATION: Engineer, maintenance (IT) full-time   PLOF: Independent   PATIENT GOALS: Return to normal function.  Walking and work.     OBJECTIVE:    DIAGNOSTIC FINDINGS: IMPRESSION: Status post total right knee arthroplasty without evidence of hardware failure.  PATIENT SURVEYS:  07/31/2022 FOTO 65 (Goal met) 07/17/2022 FOTO 61 (Goal 64) FOTO 07/03/2022: 52 (Goal 64) FOTO 06/12/2022: 52 (Goal 64, was 36); FOTO 05/20/2022: 36 (Goal 64 in 16 visits)       SENSATION: No complaints of peripheral pains or paresthesias   EDEMA:  Noted but not measured   LOWER EXTREMITY ROM:   Active ROM Right 05/20/22 eval Left 05/20/22 eval Right 05/22/22 Right 05/29/22 Right 06/04/2022 Right 06/12/2022 Right 06/26/2022 Right 07/03/2022 Right 07/17/2022 Right 07/31/2022  Hip flexion              Hip extension  Hip abduction              Hip adduction              Hip internal rotation              Hip external rotation              Knee flexion '53 133 65 78 87 97 107 107 111 113 '  Knee extension '13 1 8 6 5 3 2 1 1 1  ' Ankle dorsiflexion              Ankle plantarflexion              Ankle inversion              Ankle eversion               (Blank rows = not tested)   LOWER EXTREMITY MMT:   MMT Right eval Left eval Right 06/23/2022  Left 06/23/2022 L/R in pounds 07/03/2022 L/R in pounds 07/31/2022  Hip flexion          Hip extension          Hip abduction          Hip adduction          Hip internal rotation          Hip external rotation          Knee flexion          Knee extension 3-/5 5/5 5/5 43, 39 lbs 5/5 44.8, 43.6 lbs 56.2/60.0 68.5/67.6  Ankle dorsiflexion          Ankle plantarflexion          Ankle inversion          Ankle eversion           (Blank rows = not tested)   GAIT: 07/31/2022: Mild lateral lean without an AD.  Good with stairs.  07/17/2022: Significant for mild R lateral lean without an assistive device.  This is being addressed with hip abductors strengthening.  Stairs look very good for < 3 months post-TKA.  07/03/2022: No AD, still has increased pain with prolonged WB like at the grocery store  06/23/2022: Independent ambulation within clinic Household distances < 150 ft with lacking TKE in stnace, mild limit in hip extension/toe off progression c Rt leg   Eval:  Distance walked: 100+ feet Assistive device utilized: Environmental consultant - 2 wheeled Level of assistance: Modified independence Comments: Step-to gait       TODAY'S TREATMENT: 07/31/2022 Seated straight leg raises 3 sets of 5 for 3 seconds with 2# Recumbent bike Seat 7 full range 8 minutes Knee extension machine 20# 10X up with B, down R only slow eccentrics 10X  Functional Activities (for sit to stand and stairs)  Single leg 75# 15X 2-3 seconds hold with flexion and extension, B slow eccentrics Step-down 2 sets of 10 slow eccentrics off 4 inch step (B) Step-up and over slow eccntrics 8 inch step 10X R    Sit to stand with slow eccentrics 5X   07/17/2022 Seated straight leg raises 3 sets of 5 for 3 seconds with 1# Knee flexion AAROM (L pushes R into flexion) 10X 10 seconds Recumbent bike Seat 7 full range 8 minutes  Functional Activities (for sit to stand and stairs)  Single leg 75# 2 sets of 10X 2-3 seconds hold with flexion and  extension, B slow eccentrics Up and down  a flight of stairs in the clinic reciprocally, no hands 4X    Sit to stand with slow eccentrics 10X   Vaso 10 minutes R knee High 34* NEXT VISIT   07/10/2022 Seated straight leg raises 3 sets of 5 for 3 seconds with 1# Knee flexion AAROM (L pushes R into flexion) 10X 10 seconds Prone knee extension stretch with rolled up towels above knees 3 minutes 2# Recumbent bike Seat 7 full range 6 minutes  Functional Activities (for sit to stand and stairs)  Single leg 68# 2 sets of 10X 2-3 seconds hold with flexion and extension, B slow eccentrics Lateral step-down off 4 inch step 2 sets of 10 B slow eccentrics  Step-up and over 8 inch step slow eccentrics 10X and stairwell reciprocally 5X up and down   Vaso 10 minutes R knee High 34*    PATIENT EDUCATION:  Education details: HEP, see above Person educated: Patient and Child(ren) Education method: Explanation, Demonstration, Tactile cues, Verbal cues, and Handouts Education comprehension: verbalized understanding, returned demonstration, verbal cues required, tactile cues required, and needs further education     HOME EXERCISE PROGRAM: Access Code: [email or web address] URL: [email or web address] Date: 07/17/2022 Prepared by: Vista Mink  Exercises - Supine Quadricep Sets  - 5 x daily - 7 x weekly - 2 sets - 10 reps - 5 second hold - Seated Knee Flexion AAROM  - 3-5 x daily - 7 x weekly - 1 sets - 1 reps - 3 minutes hold - Seated Knee Extension Stretch with Chair  - 3-5 x daily - 7 x weekly - 1 sets - 1 reps - 3-5 minutes hold - Prone Knee Extension with Ankle Weight  - 3 x daily - 7 x weekly - 1 sets - 1 reps - 3-5 minutes hold - Standing Hip Hiking  - 1 x daily - 7 x weekly - 2-3 sets - 5 reps - 3-5 seconds hold   ASSESSMENT:   CLINICAL IMPRESSION:  Angela Campos has done a great job with her post-TKA rehabilitation.  AROM is 1 - 113 degrees.  Strength is objectively very good (>98% vs  uninvolved side) and she is able to ascend and descend stairs reciprocally without a handrail.  Remaining weakness is being addressed with her HEP and Angela Campos has demonstrated independence with this program.  She will be following-up with Dr. Ninfa Linden regarding some concerns with her sleep, but otherwise is doing great.  She will do her HEP independently and is welcome to return if requested for further PT.   OBJECTIVE IMPAIRMENTS Abnormal gait, decreased activity tolerance, decreased endurance, decreased knowledge of condition, decreased mobility, difficulty walking, decreased ROM, decreased strength, increased edema, impaired perceived functional ability, and pain.    ACTIVITY LIMITATIONS bending, standing, squatting, sleeping, stairs, bed mobility, and locomotion level   PARTICIPATION LIMITATIONS: driving, community activity, and occupation   PERSONAL FACTORS  No other factors  are also affecting patient's functional outcome.    REHAB POTENTIAL: Good   CLINICAL DECISION MAKING: Stable/uncomplicated   EVALUATION COMPLEXITY: Low     GOALS: Goals reviewed with patient? Yes   SHORT TERM GOALS: Target date: 06/17/2022  Improve Rt knee AROM to 5 to 90 degrees Baseline: 13 to 53 degrees Goal status: Met 3 - 97 on 06/12/2022   2.  Angela Campos will transition to a cane for walking Baseline: Wheeled walker Goal status: Met  06/04/2022   LONG TERM GOALS: Target date: 07/15/2022    Improve FOTO to 64 Baseline: 36 Goal status: Met  on 07/31/2022   2.  Improve Rt knee pain to 0-3/10 on the Numeric Pain Rating Scale Baseline: 3-7/10 Goal status: On Going 07/31/2022   3.  Improve Rt quadriceps strength as assessed by FOTO, walking without an AD and objective measures Baseline:  36 FOTO, walker and 3-/5 MMT Goal status: Met 07/17/2022   4.  Improve Rt knee AROM to 3 to 110 degrees or better Baseline: 13 to 53 degrees Goal status: Met 07/17/2022   5.  Angela Campos will be independent with her HEP at  DC Baseline: Started 05/20/2022 Goal status: Met 07/31/2022     PLAN: PT FREQUENCY: DC   PT DURATION: DC   PLANNED INTERVENTIONS: Therapeutic exercises, Therapeutic activity, Neuromuscular re-education, Balance training, Gait training, Patient/Family education, Joint mobilization, Stair training, Electrical stimulation, Cryotherapy, Vasopneumatic device, and Manual therapy   PLAN FOR NEXT SESSION: DC  Farley Ly PT, MPT 09/04/22  11:30 AM      [facility] [address] [place], [place], [address] Phone: [phone]   Fax:  [phone]

## 2023-01-19 ENCOUNTER — Ambulatory Visit (INDEPENDENT_AMBULATORY_CARE_PROVIDER_SITE_OTHER): Payer: BC Managed Care – PPO

## 2023-01-19 ENCOUNTER — Encounter: Payer: Self-pay | Admitting: Orthopaedic Surgery

## 2023-01-19 ENCOUNTER — Ambulatory Visit (INDEPENDENT_AMBULATORY_CARE_PROVIDER_SITE_OTHER): Payer: BC Managed Care – PPO | Admitting: Orthopaedic Surgery

## 2023-01-19 DIAGNOSIS — Z96651 Presence of right artificial knee joint: Secondary | ICD-10-CM

## 2023-01-19 NOTE — Progress Notes (Signed)
The patient is a 52 year old who is now 7 months status post a right press-fit total knee arthroplasty.  She said she still has pain when she is on hard surfaces and there is still numbness to the lateral aspect of her incision but overall she is doing well.  On exam there is minimal swelling of her right knee.  Her range of motion is entirely full of the knee feels ligamentously stable.  The incision is well-healed and there is numbness to the lateral aspect of her knee and I explained from an anatomic standpoint why that is the case.  2 views of the right knee show well-seated press-fit total knee arthroplasty with no evidence of loosening or complicating features.  She will continue to progress her activities as long as comfort allows.  I do want to see her back in 6 months with a final standing AP and lateral of her right operative knee.  All questions and concerns were addressed and answered.

## 2023-07-20 ENCOUNTER — Encounter: Payer: Self-pay | Admitting: Orthopaedic Surgery

## 2023-07-20 ENCOUNTER — Ambulatory Visit (INDEPENDENT_AMBULATORY_CARE_PROVIDER_SITE_OTHER): Payer: BC Managed Care – PPO | Admitting: Orthopaedic Surgery

## 2023-07-20 ENCOUNTER — Other Ambulatory Visit (INDEPENDENT_AMBULATORY_CARE_PROVIDER_SITE_OTHER): Payer: BC Managed Care – PPO

## 2023-07-20 DIAGNOSIS — Z96651 Presence of right artificial knee joint: Secondary | ICD-10-CM

## 2023-07-20 NOTE — Progress Notes (Signed)
The patient is now a year and 2 months status post a right press-fit total knee arthroplasty.  She is only 52 years old but had severe end-stage arthritis of that knee.  She is reports good range of motion and strength and she says this last night and she is doing better overall.  Examination of her right knee shows full extension and almost full flexion.  The knee feels ligamentously stable.  There is no significant swelling.  2 views of the right knee show well-seated press-fit total knee arthroplasty with no complicating features.  At this point follow-up for her knee can be as needed.  If she does develop any issues with that at all she knows to let us know.
